# Patient Record
Sex: Male | Born: 1960 | Race: Black or African American | Hispanic: No | Marital: Single | Smoking: Current every day smoker
Health system: Southern US, Community
[De-identification: ages and names within clinical notes are randomized; demographics above are authoritative.]

## PROBLEM LIST (undated history)

## (undated) DIAGNOSIS — J45909 Unspecified asthma, uncomplicated: Secondary | ICD-10-CM

## (undated) DIAGNOSIS — J449 Chronic obstructive pulmonary disease, unspecified: Secondary | ICD-10-CM

---

## 2021-04-29 ENCOUNTER — Other Ambulatory Visit: Payer: Self-pay

## 2021-04-29 ENCOUNTER — Encounter (HOSPITAL_BASED_OUTPATIENT_CLINIC_OR_DEPARTMENT_OTHER): Payer: Self-pay | Admitting: Emergency Medicine

## 2021-04-29 ENCOUNTER — Emergency Department (HOSPITAL_BASED_OUTPATIENT_CLINIC_OR_DEPARTMENT_OTHER)
Admission: EM | Admit: 2021-04-29 | Discharge: 2021-04-29 | Disposition: A | Discharge status: Home / Self Care | Payer: Medicaid Other | Attending: Emergency Medicine | Admitting: Emergency Medicine

## 2021-04-29 ENCOUNTER — Emergency Department (HOSPITAL_BASED_OUTPATIENT_CLINIC_OR_DEPARTMENT_OTHER): Payer: Medicaid Other

## 2021-04-29 DIAGNOSIS — R109 Unspecified abdominal pain: Secondary | ICD-10-CM | POA: Insufficient documentation

## 2021-04-29 DIAGNOSIS — M5432 Sciatica, left side: Secondary | ICD-10-CM | POA: Insufficient documentation

## 2021-04-29 DIAGNOSIS — M549 Dorsalgia, unspecified: Secondary | ICD-10-CM | POA: Diagnosis present

## 2021-04-29 LAB — COMPREHENSIVE METABOLIC PANEL
ALT: 33 U/L (ref 0–44)
AST: 48 U/L — ABNORMAL HIGH (ref 15–41)
Albumin: 4.1 g/dL (ref 3.5–5.0)
Alkaline Phosphatase: 59 U/L (ref 38–126)
Anion gap: 13 (ref 5–15)
BUN: 7 mg/dL (ref 6–20)
CO2: 21 mmol/L — ABNORMAL LOW (ref 22–32)
Calcium: 9 mg/dL (ref 8.9–10.3)
Chloride: 102 mmol/L (ref 98–111)
Creatinine, Ser: 0.77 mg/dL (ref 0.61–1.24)
GFR, Estimated: >60 mL/min (ref >60)
Glucose, Bld: 86 mg/dL (ref 70–99)
Potassium: 4 mmol/L (ref 3.5–5.1)
Sodium: 136 mmol/L (ref 135–145)
Total Bilirubin: 0.3 mg/dL (ref 0.3–1.2)
Total Protein: 7.9 g/dL (ref 6.5–8.1)

## 2021-04-29 LAB — CBC WITH DIFFERENTIAL/PLATELET
Abs Immature Granulocytes: 0.02 10*3/uL (ref 0.00–0.07)
Basophils Absolute: 0 10*3/uL (ref 0.0–0.1)
Basophils Relative: 0 %
Eosinophils Absolute: 0.2 10*3/uL (ref 0.0–0.5)
Eosinophils Relative: 2 %
HCT: 39.4 % (ref 39.0–52.0)
Hemoglobin: 13.5 g/dL (ref 13.0–17.0)
Immature Granulocytes: 0 %
Lymphocytes Relative: 14 %
Lymphs Abs: 1.2 10*3/uL (ref 0.7–4.0)
MCH: 29.9 pg (ref 26.0–34.0)
MCHC: 34.3 g/dL (ref 30.0–36.0)
MCV: 87.4 fL (ref 80.0–100.0)
Monocytes Absolute: 0.6 10*3/uL (ref 0.1–1.0)
Monocytes Relative: 6 %
Neutro Abs: 7 10*3/uL (ref 1.7–7.7)
Neutrophils Relative %: 78 %
Platelets: 276 10*3/uL (ref 150–400)
RBC: 4.51 MIL/uL (ref 4.22–5.81)
RDW: 15.2 % (ref 11.5–15.5)
WBC: 9 10*3/uL (ref 4.0–10.5)
nRBC: 0 % (ref 0.0–0.2)

## 2021-04-29 LAB — URINALYSIS, ROUTINE W REFLEX MICROSCOPIC
Bilirubin Urine: NEGATIVE
Glucose, UA: NEGATIVE mg/dL
Hgb urine dipstick: NEGATIVE
Ketones, ur: NEGATIVE mg/dL
Leukocytes,Ua: NEGATIVE
Nitrite: NEGATIVE
Protein, ur: NEGATIVE mg/dL
Specific Gravity, Urine: 1.015 (ref 1.005–1.030)
pH: 5.5 (ref 5.0–8.0)

## 2021-04-29 LAB — LIPASE, BLOOD: Lipase: 29 U/L (ref 11–51)

## 2021-04-29 MED ORDER — PREDNISONE 50 MG PO TABS: 50.0000 mg | ORAL_TABLET | Freq: Every day | ORAL | 0 refills | Status: AC

## 2021-04-29 MED ORDER — ONDANSETRON 4 MG PO TBDP
8.0000 mg | ORAL_TABLET | Freq: Once | ORAL | Status: AC
  Administered 2021-04-29: 8 mg via ORAL
  Filled 2021-04-29: qty 2

## 2021-04-29 MED ORDER — NAPROXEN 375 MG PO TABS: 375.0000 mg | ORAL_TABLET | Freq: Two times a day (BID) | ORAL | 0 refills | Status: AC

## 2021-04-29 MED ORDER — HYDROMORPHONE HCL 1 MG/ML IJ SOLN
1.0000 mg | Freq: Once | INTRAMUSCULAR | Status: AC
  Administered 2021-04-29: 1 mg via INTRAVENOUS
  Filled 2021-04-29: qty 1

## 2021-04-29 MED ORDER — HYDROCODONE-ACETAMINOPHEN 5-325 MG PO TABS: 1.0000 | ORAL_TABLET | Freq: Four times a day (QID) | ORAL | 0 refills | Status: AC | PRN

## 2021-04-29 MED ORDER — ONDANSETRON HCL 4 MG/2ML IJ SOLN
4.0000 mg | Freq: Once | INTRAMUSCULAR | Status: AC
  Administered 2021-04-29: 4 mg via INTRAVENOUS
  Filled 2021-04-29: qty 2

## 2021-04-29 NOTE — ED Triage Notes (Signed)
Vomiting in triage x 1.

## 2021-04-29 NOTE — ED Triage Notes (Signed)
Patient presents with complaints of mid to lower back pain; onset 2-3 days ago; states recently moved some furniture and unsure if that has caused it. States took tylenol yesterday with no relief. Ambulatory to triage.

## 2021-04-29 NOTE — ED Provider Notes (Signed)
[facility] EMERGENCY DEPARTMENT Provider Note   CSN: [id number] Arrival date & time: 04/29/21  0086     History Chief Complaint  Patient presents with   Back Pain    Scott Colon is a 60 y.o. male.   Back Pain  Patient presents ED with complaints of left sided flank pain that started a few days ago.  The pain is sharp and severe and radiates down the left side towards his leg.  He denies any recent falls or injuries.  He did do some furniture moving recently.  He tried taking some Tylenol without relief.  He is able to walk and is not having numbness or weakness.  He denies having abdominal pain.  He did have an episode of nausea and vomiting with this pain.  The pain is very severe.  He denies any fevers or chills.  He does have history of prior back problems but no kidney stones.  Patient states 1 time he had to have an injection in his back for his troubles.  History reviewed. No pertinent past medical history.  There are no problems to display for this patient.   History reviewed. No pertinent surgical history.     No family history on file.     Home Medications Prior to Admission medications   Medication Sig Start Date End Date Taking? Authorizing Provider  HYDROcodone-acetaminophen (NORCO/VICODIN) 5-325 MG tablet Take 1 tablet by mouth every 6 (six) hours as needed. 04/29/21  Yes Linwood Dibbles, MD  naproxen (NAPROSYN) 375 MG tablet Take 1 tablet (375 mg total) by mouth 2 (two) times daily. 04/29/21  Yes Linwood Dibbles, MD  predniSONE (DELTASONE) 50 MG tablet Take 1 tablet (50 mg total) by mouth daily for 7 days. 04/29/21 05/06/21 Yes Linwood Dibbles, MD    Allergies    Patient has no known allergies.  Review of Systems   Review of Systems  Musculoskeletal:  Positive for back pain.  All other systems reviewed and are negative.  Physical Exam Updated Vital Signs BP 117/73   Pulse 69   Temp 98.1 F (36.7 C) (Oral)   Resp 16   Ht 1.854 m (6\' 1" )   Wt 69.9 kg    SpO2 96%   BMI 20.32 kg/m   Physical Exam Vitals and nursing note reviewed.  Constitutional:      General: He is not in acute distress.    Appearance: He is well-developed.  HENT:     Head: Normocephalic and atraumatic.     Right Ear: External ear normal.     Left Ear: External ear normal.  Eyes:     General: No scleral icterus.       Right eye: No discharge.        Left eye: No discharge.     Conjunctiva/sclera: Conjunctivae normal.  Neck:     Trachea: No tracheal deviation.  Cardiovascular:     Rate and Rhythm: Normal rate and regular rhythm.  Pulmonary:     Effort: Pulmonary effort is normal. No respiratory distress.     Breath sounds: Normal breath sounds. No stridor. No wheezing or rales.  Abdominal:     General: Bowel sounds are normal. There is no distension.     Palpations: Abdomen is soft.     Tenderness: There is no abdominal tenderness. There is left CVA tenderness. There is no guarding or rebound.  Musculoskeletal:        General: No tenderness or deformity.     Cervical  back: Neck supple.     Comments: Tenderness palpation paraspinal muscles in the left lumbar spine region and left costovertebral angle region  Skin:    General: Skin is warm and dry.     Findings: No rash.  Neurological:     General: No focal deficit present.     Mental Status: He is alert.     Cranial Nerves: No cranial nerve deficit (no facial droop, extraocular movements intact, no slurred speech).     Sensory: No sensory deficit.     Motor: No abnormal muscle tone or seizure activity.     Coordination: Coordination normal.     Comments: 5 out of 5 plantar flexion and dorsiflexion, normal sensation  Psychiatric:        Mood and Affect: Mood normal.    ED Results / Procedures / Treatments   Labs (all labs ordered are listed, but only abnormal results are displayed) Labs Reviewed  COMPREHENSIVE METABOLIC PANEL - Abnormal; Notable for the following components:      Result Value   CO2  21 (*)    AST 48 (*)    All other components within normal limits  LIPASE, BLOOD  CBC WITH DIFFERENTIAL/PLATELET  URINALYSIS, ROUTINE W REFLEX MICROSCOPIC    EKG None  Radiology CT RENAL STONE STUDY  Result Date: 04/29/2021 CLINICAL DATA:  Flank pain for 2 days, initial encounter EXAM: CT ABDOMEN AND PELVIS WITHOUT CONTRAST TECHNIQUE: Multidetector CT imaging of the abdomen and pelvis was performed following the standard protocol without IV contrast. COMPARISON:  None. FINDINGS: Lower chest: No acute abnormality. Hepatobiliary: No focal liver abnormality is seen. Status post cholecystectomy. No biliary dilatation. Pancreas: Unremarkable. No pancreatic ductal dilatation or surrounding inflammatory changes. Spleen: Normal in size without focal abnormality. Adrenals/Urinary Tract: Adrenal glands are within normal limits. Kidneys demonstrate no renal calculi or urinary tract obstructive changes. Ureters are within normal limits. Bladder is well distended. Stomach/Bowel: Colon shows no obstructive or inflammatory changes. Small appendix is noted without inflammatory change. Small bowel and stomach appear within normal limits. Vascular/Lymphatic: Aortic atherosclerosis. No enlarged abdominal or pelvic lymph nodes. Reproductive: Prostate is unremarkable. Other: No abdominal wall hernia or abnormality. No abdominopelvic ascites. Musculoskeletal: Degenerative changes of the sacroiliac joints are noted bilaterally. No acute bony abnormality is seen. IMPRESSION: No renal calculi or urinary tract obstructive changes are seen. No acute abnormality to correspond with the given clinical history is seen. Electronically Signed   By: Alcide Clever M.D.   On: 04/29/2021 08:15    Procedures Procedures   Medications Ordered in ED Medications  ondansetron (ZOFRAN-ODT) disintegrating tablet 8 mg (8 mg Oral Given 04/29/21 0614)  ondansetron (ZOFRAN) injection 4 mg (4 mg Intravenous Given 04/29/21 0750)  HYDROmorphone  (DILAUDID) injection 1 mg (1 mg Intravenous Given 04/29/21 0751)    ED Course  I have reviewed the triage vital signs and the nursing notes.  Pertinent labs & imaging results that were available during my care of the patient were reviewed by me and considered in my medical decision making (see chart for details).  Clinical Course as of 04/29/21 0912  Wed Apr 29, 2021  0855 CBC and metabolic panel are normal [JK]  0856 UA is negative [JK]  0856 CT scan without acute findings [JK]    Clinical Course User Index [JK] Linwood Dibbles, MD   MDM Rules/Calculators/A&P  Patient presents to the ED for evaluation of of back pain, flank pain.  Symptoms concerning for the possibility of sciatica versus ureteral stone.  Patient was treated with pain medications.  Laboratory tests are reassuring.  No signs to suggest infection.  No signs of anemia.  CT scan was performed and there is no evidence of aneurysm or ureteral stone.  No other acute abdominal pathology.  No bony abnormalities noted.  Patient was treated with pain medications.  I suspect his symptoms are most likely related to sciatica at this point.  He does not show any signs of acute neurologic dysfunction and does not require any further imaging at this time.  Will discharge home with course of steroids and pain medications.  Recommend follow-up with primary care doctor or spine doctor Final Clinical Impression(s) / ED Diagnoses Final diagnoses:  Sciatica of left side    Rx / DC Orders ED Discharge Orders          Ordered    HYDROcodone-acetaminophen (NORCO/VICODIN) 5-325 MG tablet  Every 6 hours PRN        04/29/21 0911    naproxen (NAPROSYN) 375 MG tablet  2 times daily        04/29/21 0911    predniSONE (DELTASONE) 50 MG tablet  Daily        04/29/21 0911             Linwood Dibbles, MD 04/29/21 [phone]

## 2021-04-29 NOTE — Discharge Instructions (Addendum)
Take the medications as prescribed to help with the pain and discomfort.  Follow-up with your doctor or consider seeing a spine doctor if the symptoms persist.  Try to rest and avoid any heavy lifting

## 2022-05-30 IMAGING — CT CT RENAL STONE PROTOCOL
2 of 4 series · 16 of 46 positions shown, 18 images · non-contrast
Comparison: None.

CLINICAL DATA: Flank pain for 2 days, initial encounter

EXAM:
CT ABDOMEN AND PELVIS WITHOUT CONTRAST
TECHNIQUE: Multidetector CT imaging of the abdomen and pelvis was performed
following the standard protocol without IV contrast.

[Series 2: axial st · axial · 0.80mm/px · z∈[-609,-129]mm · 13 of 104 slices shown, 15 images]
[im 4/104  soft-tissue]
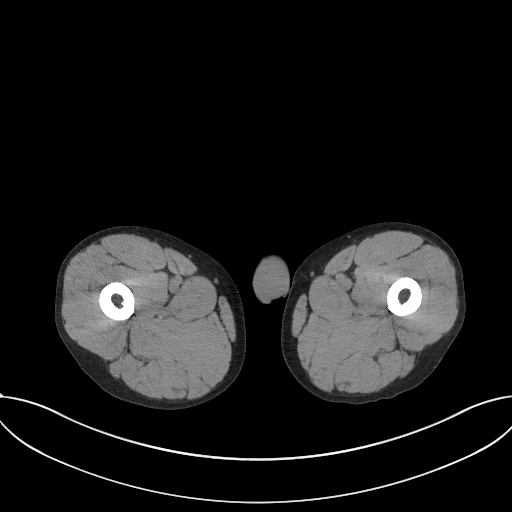
[im 4/104  bone]
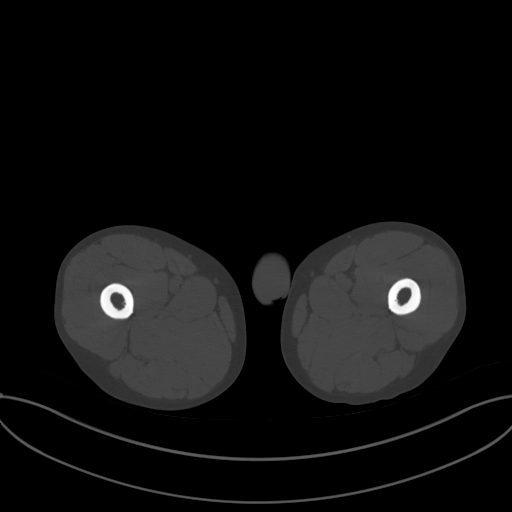
[im 12/104  soft-tissue]
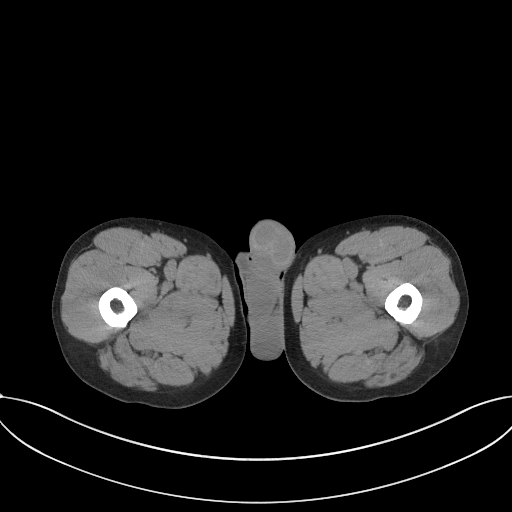
[im 20/104  soft-tissue]
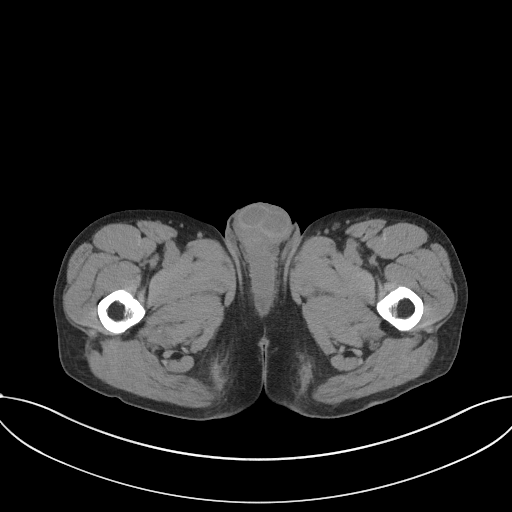
[im 28/104  soft-tissue]
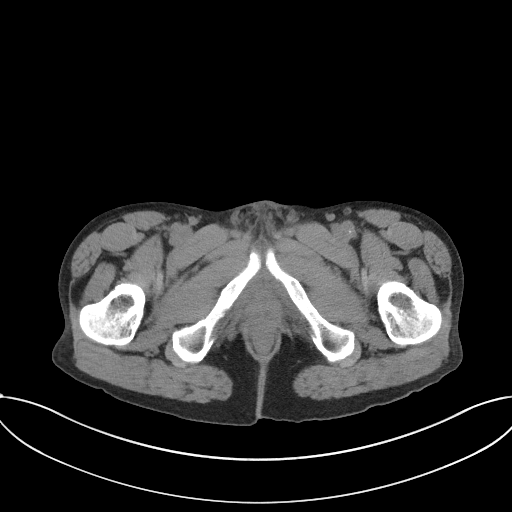
[im 36/104  soft-tissue]
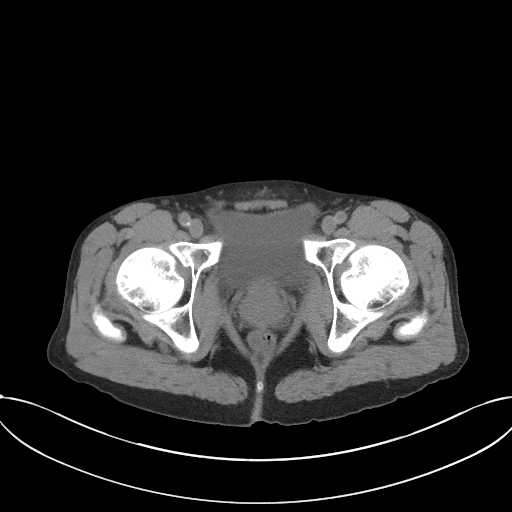
[im 44/104  soft-tissue]
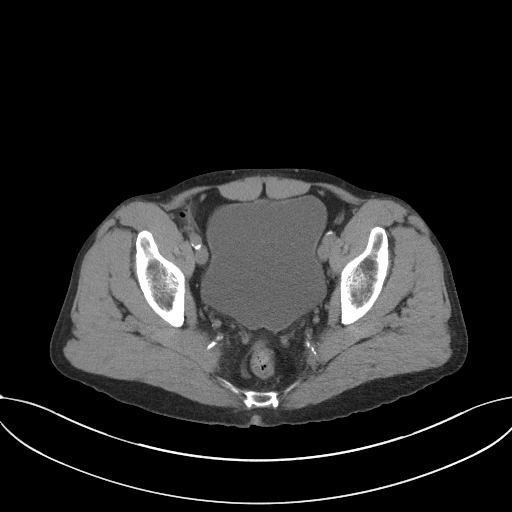
[im 52/104  soft-tissue]
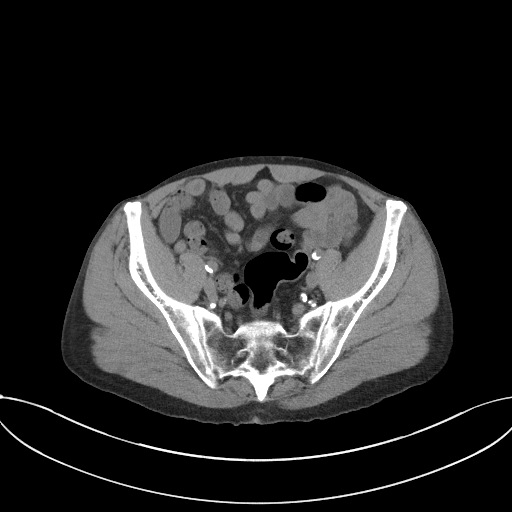
[im 60/104  soft-tissue]
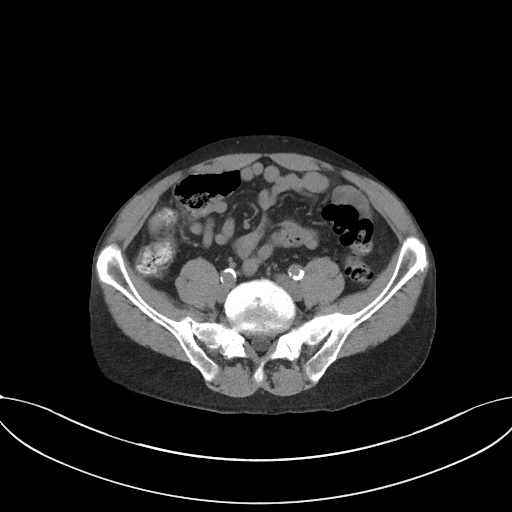
[im 68/104  soft-tissue]
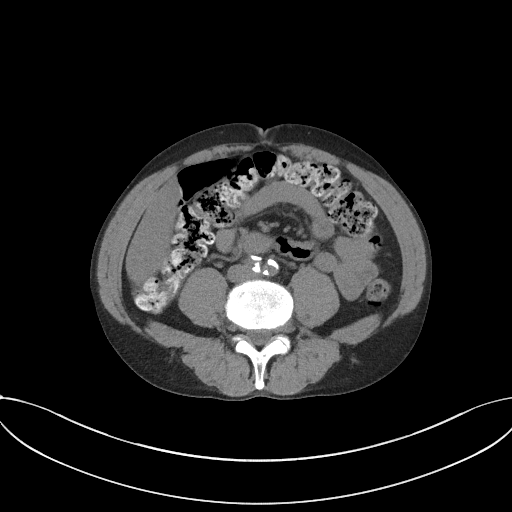
[im 68/104  bone]
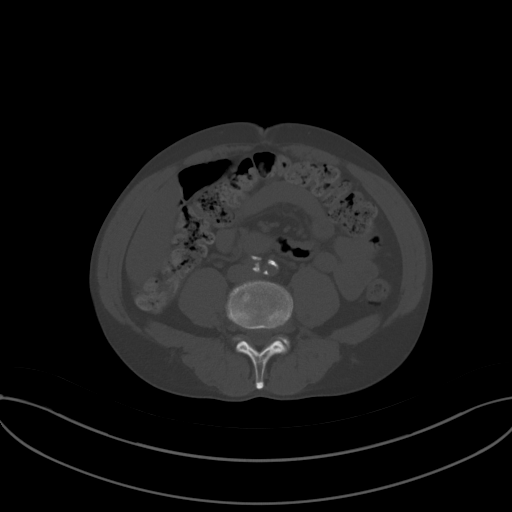
[im 76/104  soft-tissue]
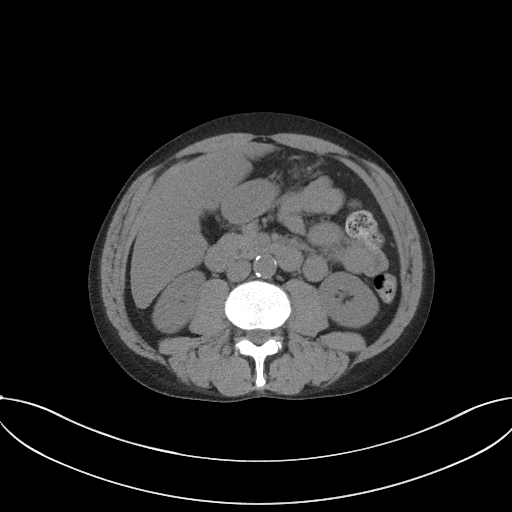
[im 84/104  soft-tissue]
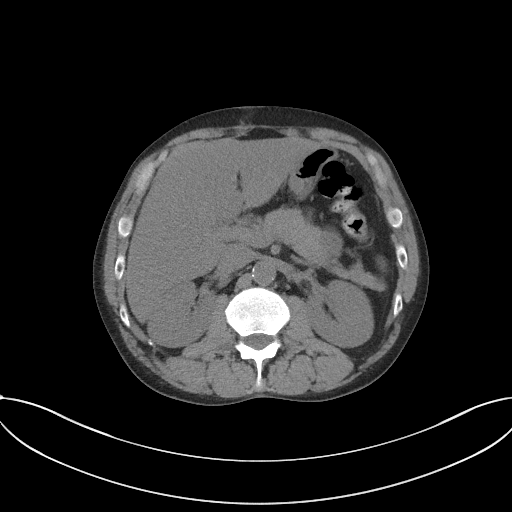
[im 92/104  soft-tissue]
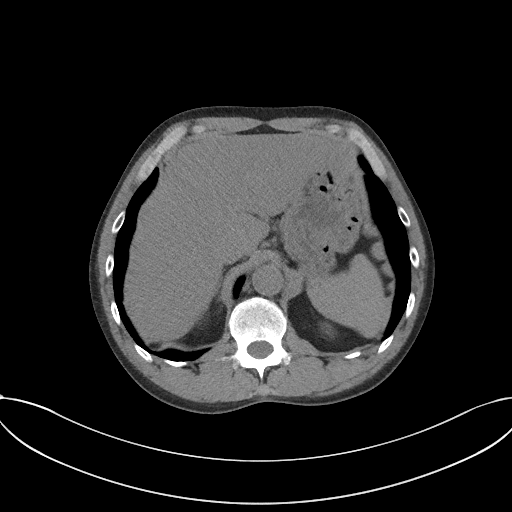
[im 100/104  soft-tissue]
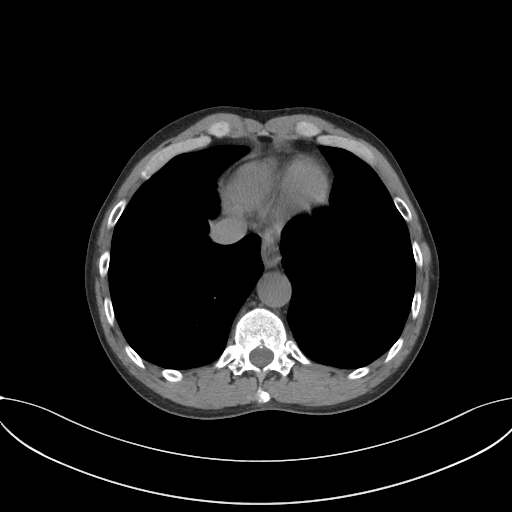

[Series 5: coronal st · coronal · 0.74mm/px · 3 of 89 slices shown]
[im 30/89  soft-tissue]
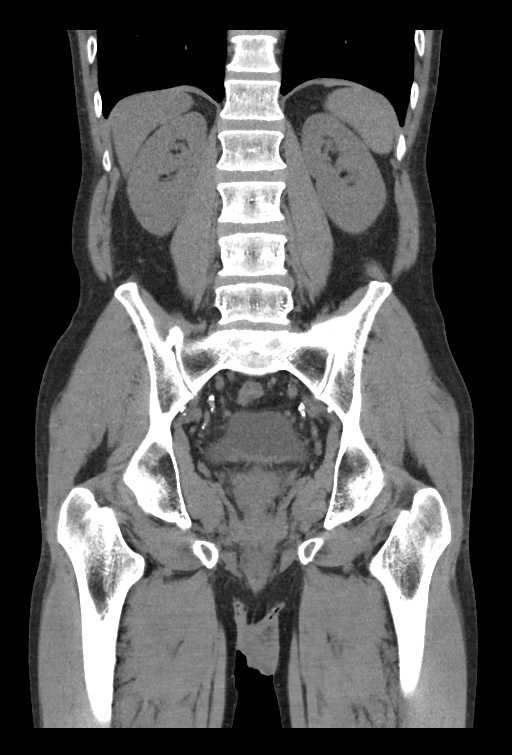
[im 40/89  soft-tissue]
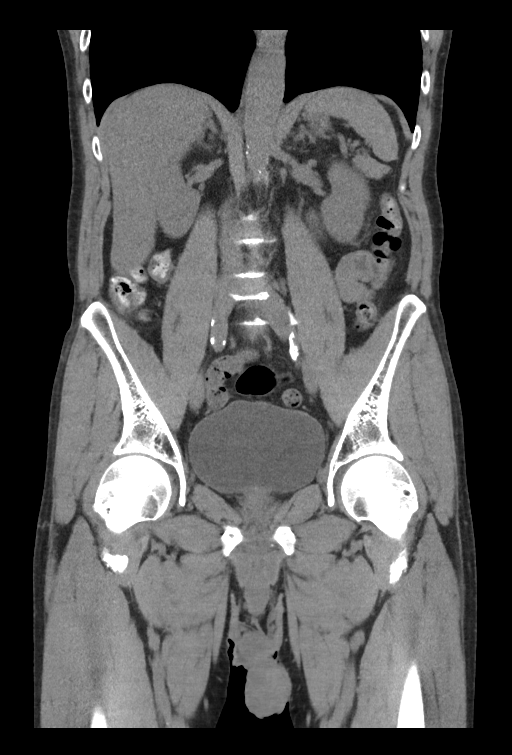
[im 49/89  soft-tissue]
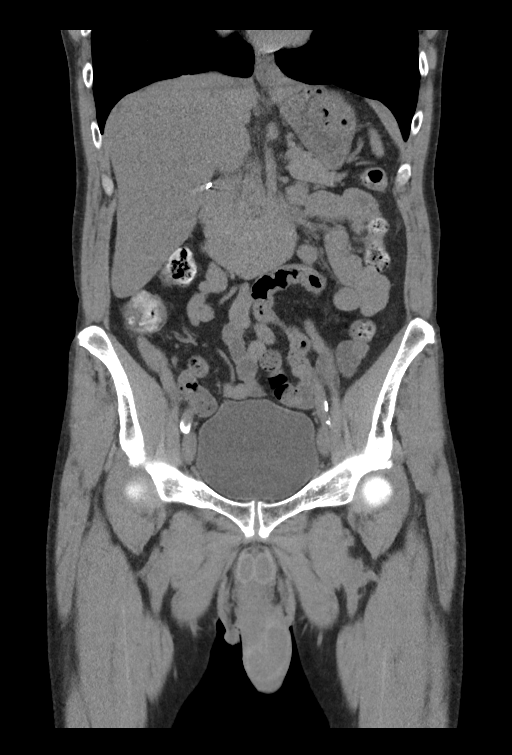

[16 of 46 positions shown; findings below may reference images not displayed]

FINDINGS: Lower chest: No acute abnormality.

Hepatobiliary: No focal liver abnormality is seen. Status post
cholecystectomy. No biliary dilatation.

Pancreas: Unremarkable. No pancreatic ductal dilatation or
surrounding inflammatory changes.

Spleen: Normal in size without focal abnormality.

Adrenals/Urinary Tract: Adrenal glands are within normal limits.
Kidneys demonstrate no renal calculi or urinary tract obstructive
changes. Ureters are within normal limits. Bladder is well
distended.

Stomach/Bowel: Colon shows no obstructive or inflammatory changes.
Small appendix is noted without inflammatory change. Small bowel and
stomach appear within normal limits.

Vascular/Lymphatic: Aortic atherosclerosis. No enlarged abdominal or
pelvic lymph nodes.

Reproductive: Prostate is unremarkable.

Other: No abdominal wall hernia or abnormality. No abdominopelvic
ascites.

Musculoskeletal: Degenerative changes of the sacroiliac joints are
noted bilaterally. No acute bony abnormality is seen.
IMPRESSION: No renal calculi or urinary tract obstructive changes are seen.

No acute abnormality to correspond with the given clinical history
is seen.

## 2023-02-19 ENCOUNTER — Emergency Department (HOSPITAL_COMMUNITY)
Admission: EM | Admit: 2023-02-19 | Discharge: 2023-02-19 | Discharge status: Against Medical Advice | Payer: Medicaid Other | Attending: Student | Admitting: Student

## 2023-02-19 ENCOUNTER — Other Ambulatory Visit: Payer: Self-pay

## 2023-02-19 DIAGNOSIS — R109 Unspecified abdominal pain: Secondary | ICD-10-CM | POA: Insufficient documentation

## 2023-02-19 DIAGNOSIS — Z5321 Procedure and treatment not carried out due to patient leaving prior to being seen by health care provider: Secondary | ICD-10-CM | POA: Diagnosis not present

## 2023-02-19 DIAGNOSIS — R1110 Vomiting, unspecified: Secondary | ICD-10-CM | POA: Insufficient documentation

## 2023-02-19 LAB — CBC
HCT: 40 % (ref 39.0–52.0)
Hemoglobin: 13.7 g/dL (ref 13.0–17.0)
MCH: 29.3 pg (ref 26.0–34.0)
MCHC: 34.3 g/dL (ref 30.0–36.0)
MCV: 85.7 fL (ref 80.0–100.0)
Platelets: 250 10*3/uL (ref 150–400)
RBC: 4.67 MIL/uL (ref 4.22–5.81)
RDW: 14.5 % (ref 11.5–15.5)
WBC: 5.7 10*3/uL (ref 4.0–10.5)
nRBC: 0 % (ref 0.0–0.2)

## 2023-02-19 LAB — COMPREHENSIVE METABOLIC PANEL
ALT: 50 U/L — ABNORMAL HIGH (ref 0–44)
AST: 58 U/L — ABNORMAL HIGH (ref 15–41)
Albumin: 4.5 g/dL (ref 3.5–5.0)
Alkaline Phosphatase: 53 U/L (ref 38–126)
Anion gap: 14 (ref 5–15)
BUN: 13 mg/dL (ref 6–20)
CO2: 23 mmol/L (ref 22–32)
Calcium: 9.4 mg/dL (ref 8.9–10.3)
Chloride: 98 mmol/L (ref 98–111)
Creatinine, Ser: 1.2 mg/dL (ref 0.61–1.24)
GFR, Estimated: >60 mL/min (ref >60)
Glucose, Bld: 94 mg/dL (ref 70–99)
Potassium: 4.1 mmol/L (ref 3.5–5.1)
Sodium: 135 mmol/L (ref 135–145)
Total Bilirubin: 0.7 mg/dL (ref 0.3–1.2)
Total Protein: 7.8 g/dL (ref 6.5–8.1)

## 2023-02-19 LAB — LIPASE, BLOOD: Lipase: 24 U/L (ref 11–51)

## 2023-02-19 NOTE — ED Notes (Signed)
Pt left AMA °

## 2023-02-19 NOTE — ED Triage Notes (Addendum)
Patient arrived with EMS from home reports pancreatitis flare  up this evening with mid abdominal pain and emesis . He received Zofran 4 mg IV by EMS . CBG=121.

## 2023-02-22 ENCOUNTER — Encounter (HOSPITAL_COMMUNITY): Payer: Self-pay

## 2023-02-22 ENCOUNTER — Emergency Department (HOSPITAL_COMMUNITY)
Admission: EM | Admit: 2023-02-22 | Discharge: 2023-02-22 | Disposition: A | Discharge status: Home / Self Care | Payer: Medicaid Other | Attending: Emergency Medicine | Admitting: Emergency Medicine

## 2023-02-22 ENCOUNTER — Other Ambulatory Visit: Payer: Self-pay

## 2023-02-22 ENCOUNTER — Emergency Department (HOSPITAL_COMMUNITY): Payer: Medicaid Other

## 2023-02-22 DIAGNOSIS — R1010 Upper abdominal pain, unspecified: Secondary | ICD-10-CM | POA: Diagnosis not present

## 2023-02-22 DIAGNOSIS — N179 Acute kidney failure, unspecified: Secondary | ICD-10-CM | POA: Diagnosis not present

## 2023-02-22 DIAGNOSIS — J45909 Unspecified asthma, uncomplicated: Secondary | ICD-10-CM | POA: Insufficient documentation

## 2023-02-22 DIAGNOSIS — R112 Nausea with vomiting, unspecified: Secondary | ICD-10-CM | POA: Diagnosis not present

## 2023-02-22 DIAGNOSIS — R109 Unspecified abdominal pain: Secondary | ICD-10-CM | POA: Diagnosis present

## 2023-02-22 DIAGNOSIS — J449 Chronic obstructive pulmonary disease, unspecified: Secondary | ICD-10-CM | POA: Insufficient documentation

## 2023-02-22 HISTORY — DX: Unspecified asthma, uncomplicated: J45.909

## 2023-02-22 HISTORY — DX: Chronic obstructive pulmonary disease, unspecified: J44.9

## 2023-02-22 LAB — CBC WITH DIFFERENTIAL/PLATELET
Abs Immature Granulocytes: 0.01 10*3/uL (ref 0.00–0.07)
Basophils Absolute: 0 10*3/uL (ref 0.0–0.1)
Basophils Relative: 0 %
Eosinophils Absolute: 0 10*3/uL (ref 0.0–0.5)
Eosinophils Relative: 0 %
HCT: 44.7 % (ref 39.0–52.0)
Hemoglobin: 15.1 g/dL (ref 13.0–17.0)
Immature Granulocytes: 0 %
Lymphocytes Relative: 32 %
Lymphs Abs: 2 10*3/uL (ref 0.7–4.0)
MCH: 28.8 pg (ref 26.0–34.0)
MCHC: 33.8 g/dL (ref 30.0–36.0)
MCV: 85.1 fL (ref 80.0–100.0)
Monocytes Absolute: 1 10*3/uL (ref 0.1–1.0)
Monocytes Relative: 16 %
Neutro Abs: 3.3 10*3/uL (ref 1.7–7.7)
Neutrophils Relative %: 52 %
Platelets: 280 10*3/uL (ref 150–400)
RBC: 5.25 MIL/uL (ref 4.22–5.81)
RDW: 13.5 % (ref 11.5–15.5)
WBC: 6.4 10*3/uL (ref 4.0–10.5)
nRBC: 0 % (ref 0.0–0.2)

## 2023-02-22 LAB — COMPREHENSIVE METABOLIC PANEL
ALT: 73 U/L — ABNORMAL HIGH (ref 0–44)
AST: 63 U/L — ABNORMAL HIGH (ref 15–41)
Albumin: 4.4 g/dL (ref 3.5–5.0)
Alkaline Phosphatase: 45 U/L (ref 38–126)
Anion gap: 15 (ref 5–15)
BUN: 25 mg/dL — ABNORMAL HIGH (ref 6–20)
CO2: 18 mmol/L — ABNORMAL LOW (ref 22–32)
Calcium: 9.2 mg/dL (ref 8.9–10.3)
Chloride: 98 mmol/L (ref 98–111)
Creatinine, Ser: 1.46 mg/dL — ABNORMAL HIGH (ref 0.61–1.24)
GFR, Estimated: 55 mL/min — ABNORMAL LOW (ref >60)
Glucose, Bld: 100 mg/dL — ABNORMAL HIGH (ref 70–99)
Potassium: 3.4 mmol/L — ABNORMAL LOW (ref 3.5–5.1)
Sodium: 131 mmol/L — ABNORMAL LOW (ref 135–145)
Total Bilirubin: 1.1 mg/dL (ref 0.3–1.2)
Total Protein: 8.3 g/dL — ABNORMAL HIGH (ref 6.5–8.1)

## 2023-02-22 LAB — TROPONIN I (HIGH SENSITIVITY)
Troponin I (High Sensitivity): 13 ng/L (ref <18)
Troponin I (High Sensitivity): 10 ng/L (ref <18)

## 2023-02-22 LAB — LIPASE, BLOOD: Lipase: 25 U/L (ref 11–51)

## 2023-02-22 MED ORDER — SODIUM CHLORIDE 0.9 % IV BOLUS
1000.0000 mL | Freq: Once | INTRAVENOUS | Status: AC
  Administered 2023-02-22: 1000 mL via INTRAVENOUS

## 2023-02-22 MED ORDER — ONDANSETRON HCL 4 MG/2ML IJ SOLN
4.0000 mg | Freq: Once | INTRAMUSCULAR | Status: AC
  Administered 2023-02-22: 4 mg via INTRAVENOUS
  Filled 2023-02-22: qty 2

## 2023-02-22 MED ORDER — OXYCODONE-ACETAMINOPHEN 5-325 MG PO TABS
1.0000 | ORAL_TABLET | Freq: Once | ORAL | Status: AC
  Administered 2023-02-22: 1 via ORAL
  Filled 2023-02-22: qty 1

## 2023-02-22 MED ORDER — PANTOPRAZOLE SODIUM 40 MG PO TBEC: 40.0000 mg | DELAYED_RELEASE_TABLET | Freq: Every day | ORAL | 0 refills | Status: DC

## 2023-02-22 MED ORDER — SUCRALFATE 1 GM/10ML PO SUSP: 1.0000 g | Freq: Three times a day (TID) | ORAL | 0 refills | Status: DC

## 2023-02-22 MED ORDER — SODIUM CHLORIDE (PF) 0.9 % IJ SOLN
INTRAMUSCULAR | Status: AC
  Filled 2023-02-22: qty 50

## 2023-02-22 MED ORDER — IOHEXOL 300 MG/ML  SOLN
100.0000 mL | Freq: Once | INTRAMUSCULAR | Status: AC | PRN
  Administered 2023-02-22: 100 mL via INTRAVENOUS

## 2023-02-22 MED ORDER — ONDANSETRON HCL 4 MG PO TABS: 4.0000 mg | ORAL_TABLET | Freq: Four times a day (QID) | ORAL | 0 refills | Status: DC | PRN

## 2023-02-22 NOTE — Discharge Instructions (Addendum)
It was a pleasure take care of you today!  Your labs and CT scan did not show any concerning acute emergent findings at this time.  You were sent a prescription for Zofran, Protonix, Carafate, take as directed.  If you take the Zofran, wait at least 30 minutes before you attempt small sips/small bites of food/fluid.  Ensure to maintain fluid intake with water, tea, soup, Gatorade, Pedialyte.  Follow-up with your primary care provider regarding today's ED visit.  Return to the emergency department if you experience increasing/worsening symptoms.

## 2023-02-22 NOTE — ED Provider Notes (Signed)
[place] EMERGENCY DEPARTMENT AT [facility] Provider Note   CSN: [id number] Arrival date & time: 02/22/23  0547     History  Chief Complaint  Patient presents with   Emesis   Chest Pain   Abdominal Pain    Scott Colon is a 62 y.o. male with a PMHx of COPD, asthma, who presents to the ED with concerns for abdominal pain onset 3 days.  He notes that he thinks is a flare of his pancreatitis.  Patient does not have a gallbladder at this time.  Patient drinks 14-15 beers daily.  Patient has associated left-sided chest pain that radiates to his left arm that started yesterday. Denies shortness of breath, diarrhea, constipation, urinary symptoms. Denies PMHx of MI, DM, HTN, CAD, family history of MI in someone younger than age 30, stents.   The history is provided by the patient. No language interpreter was used.       Home Medications Prior to Admission medications   Medication Sig Start Date End Date Taking? Authorizing Provider  ondansetron (ZOFRAN) 4 MG tablet Take 1 tablet (4 mg total) by mouth every 6 (six) hours as needed for nausea or vomiting. 02/22/23  Yes Khai Torbert A, PA-C  pantoprazole (PROTONIX) 40 MG tablet Take 1 tablet (40 mg total) by mouth daily. 02/22/23  Yes Vraj Denardo A, PA-C  sucralfate (CARAFATE) 1 GM/10ML suspension Take 10 mLs (1 g total) by mouth 4 (four) times daily -  with meals and at bedtime. 02/22/23  Yes Lucas Winograd A, PA-C  HYDROcodone-acetaminophen (NORCO/VICODIN) 5-325 MG tablet Take 1 tablet by mouth every 6 (six) hours as needed. 04/29/21   Linwood Dibbles, MD  naproxen (NAPROSYN) 375 MG tablet Take 1 tablet (375 mg total) by mouth 2 (two) times daily. 04/29/21   Linwood Dibbles, MD      Allergies    Patient has no known allergies.    Review of Systems   Review of Systems  Gastrointestinal:  Positive for vomiting.  All other systems reviewed and are negative.   Physical Exam Updated Vital Signs BP 101/76 (BP Location: Right Arm)    Pulse (!) 112   Temp (!) 97.4 F (36.3 C) (Oral)   Resp 20   Ht  (1.854 m)   Wt 68 kg   SpO2 99%   BMI 19.79 kg/m  Physical Exam Vitals and nursing note reviewed.  Constitutional:      General: He is not in acute distress.    Appearance: He is not diaphoretic.     Comments: Pt resting comfortably on stretcher  HENT:     Head: Normocephalic and atraumatic.     Mouth/Throat:     Pharynx: No oropharyngeal exudate.  Eyes:     General: No scleral icterus.    Conjunctiva/sclera: Conjunctivae normal.  Cardiovascular:     Rate and Rhythm: Normal rate and regular rhythm.     Pulses: Normal pulses.     Heart sounds: Normal heart sounds.  Pulmonary:     Effort: Pulmonary effort is normal. No respiratory distress.     Breath sounds: Normal breath sounds. No wheezing.  Abdominal:     General: Bowel sounds are normal.     Palpations: Abdomen is soft. There is no mass.     Tenderness: There is abdominal tenderness. There is no guarding or rebound.     Comments: Mild abdominal TTP.  Musculoskeletal:        General: Normal range of motion.  Cervical back: Normal range of motion and neck supple.  Skin:    General: Skin is warm and dry.  Neurological:     Mental Status: He is alert.  Psychiatric:        Behavior: Behavior normal.     ED Results / Procedures / Treatments   Labs (all labs ordered are listed, but only abnormal results are displayed) Labs Reviewed  COMPREHENSIVE METABOLIC PANEL - Abnormal; Notable for the following components:      Result Value   Sodium 131 (*)    Potassium 3.4 (*)    CO2 18 (*)    Glucose, Bld 100 (*)    BUN 25 (*)    Creatinine, Ser 1.46 (*)    Total Protein 8.3 (*)    AST 63 (*)    ALT 73 (*)    GFR, Estimated 55 (*)    All other components within normal limits  CBC WITH DIFFERENTIAL/PLATELET  LIPASE, BLOOD  TROPONIN I (HIGH SENSITIVITY)  TROPONIN I (HIGH SENSITIVITY)    EKG EKG Interpretation  Date/Time:  Tuesday February 22 2023 05:56:46 EDT Ventricular Rate:  110 PR Interval:  140 QRS Duration: 98 QT Interval:  338 QTC Calculation: 458 R Axis:   85 Text Interpretation: Sinus tachycardia Confirmed by Kristine Royal [phone]) on 02/22/2023 8:53:42 AM  Radiology CT ABDOMEN PELVIS W CONTRAST  Result Date: 02/22/2023 CLINICAL DATA:  Nonlocalized abdominal pain. EXAM: CT ABDOMEN AND PELVIS WITH CONTRAST TECHNIQUE: Multidetector CT imaging of the abdomen and pelvis was performed using the standard protocol following bolus administration of intravenous contrast. RADIATION DOSE REDUCTION: This exam was performed according to the departmental dose-optimization program which includes automated exposure control, adjustment of the mA and/or kV according to patient size and/or use of iterative reconstruction technique. CONTRAST:  OMNIPAQUE IOHEXOL 300 MG/ML  SOLN COMPARISON:  CT stone study 04/29/2021 FINDINGS: Lower chest: Unremarkable. Hepatobiliary: No suspicious focal abnormality within the liver parenchyma. 3 mm adjacent hypoattenuating lesions in the anterior liver are too small to characterize but almost assuredly benign. No followup imaging is recommended. Small area of low attenuation in the anterior liver, adjacent to the falciform ligament, is in a characteristic location for focal fatty deposition. Gallbladder is surgically absent. No intrahepatic or extrahepatic biliary dilation. Pancreas: No focal mass lesion. No dilatation of the main duct. No intraparenchymal cyst. No peripancreatic edema. Spleen: No splenomegaly. No focal mass lesion. Adrenals/Urinary Tract: No adrenal nodule or mass. Kidneys unremarkable. No evidence for hydroureter. The urinary bladder appears normal for the degree of distention. Stomach/Bowel: Stomach is unremarkable. No gastric wall thickening. No evidence of outlet obstruction. Duodenum is normally positioned as is the ligament of Treitz. No small bowel wall thickening. No small bowel  dilatation. The terminal ileum is normal. The appendix is not well visualized, but there is no edema or inflammation in the region of the cecum. No gross colonic mass. No colonic wall thickening. Vascular/Lymphatic: There is moderate atherosclerotic calcification of the abdominal aorta without aneurysm. There is no gastrohepatic or hepatoduodenal ligament lymphadenopathy. No retroperitoneal or mesenteric lymphadenopathy. No pelvic sidewall lymphadenopathy. Reproductive: The prostate gland and seminal vesicles are unremarkable. Other: No intraperitoneal free fluid. Musculoskeletal: No worrisome lytic or sclerotic osseous abnormality. IMPRESSION: 1. No acute findings in the abdomen or pelvis. Specifically, no findings to explain the patient's history of abdominal pain. 2.  Aortic Atherosclerosis (ICD10-I70.0). Electronically Signed   By: Kennith Center M.D.   On: 02/22/2023 11:04   DG Chest Portable 1  View  Result Date: 02/22/2023 CLINICAL DATA:  Chest pain and vomiting. EXAM: PORTABLE CHEST 1 VIEW COMPARISON:  None Available. FINDINGS: Lungs are hyperexpanded. The lungs are clear without focal pneumonia, edema, pneumothorax or pleural effusion. No evidence for pneumomediastinum. The cardiopericardial silhouette is within normal limits for size. No acute bony abnormality. Telemetry leads overlie the chest. IMPRESSION: Hyperexpansion without acute cardiopulmonary findings. Electronically Signed   By: Kennith Center M.D.   On: 02/22/2023 06:24    Procedures Procedures    Medications Ordered in ED Medications  sodium chloride 0.9 % bolus 1,000 mL (1,000 mLs Intravenous New Bag/Given 02/22/23 0622)  ondansetron (ZOFRAN) injection 4 mg (4 mg Intravenous Given 02/22/23 0621)  oxyCODONE-acetaminophen (PERCOCET/ROXICET) 5-325 MG per tablet 1 tablet (1 tablet Oral Given 02/22/23 0918)  iohexol (OMNIPAQUE) 300 MG/ML solution 100 mL (100 mLs Intravenous Contrast Given 02/22/23 1035)    ED Course/ Medical Decision  Making/ A&P Clinical Course as of 02/22/23 1133  Tue Feb 22, 2023  1011 Re-evaluated and resting comfortably on stretcher.  Patient notes that his pain is subsided however he feels some nausea at this time. [SB]  1114 Re-evaluated and resting comfortably on stretcher. Pt noted improvement of symptoms with treatment regimen. Discussed discharge treatment plan. Pt agreeable at this time. Pt appears safe for discharge. [SB]    Clinical Course User Index [SB] Alaija Ruble A, PA-C                             Medical Decision Making Amount and/or Complexity of Data Reviewed Radiology: ordered.  Risk Prescription drug management.   Patient presents to the emergency department with concerns for abdominal pain onset 3 days.  Thinks it is a flare of his pancreatitis.  Patient drinks 14-15 beers daily.  Also has left-sided chest pain started on yesterday.  No cardiac risk factors.  Patient afebrile, not tachycardic or hypoxic.  On exam patient with mild diffuse abdominal tenderness to palpation.  Remainder of exam without acute findings.  Differential diagnosis includes pancreatitis, appendicitis, diverticulitis, ACS, PNA.   Labs:  I ordered, and personally interpreted labs.  The pertinent results include:  Lipase unremarkable Initial troponin at 13, delta troponin at 10 CBC without leukocytosis CMP with slightly decreased sodium of 131, creatinine elevated to 1.46, BUN elevated 25, likely AKI.  AST and ALT slightly elevated (stable from previous value)  Imaging: I ordered imaging studies including Chest x-ray CT abdomen pelvis with I independently visualized and interpreted imaging which showed CXR without acute findings. CT AP with  1. No acute findings in the abdomen or pelvis. Specifically, no  findings to explain the patient's history of abdominal pain.  2.  Aortic Atherosclerosis   I agree with the radiologist interpretation  Medications:  I ordered medication including IV fluids,  Zofran, Percocet for symptom management. Reevaluation of the patient after these medicines and interventions, I reevaluated the patient and found that they have improved I have reviewed the patients home medicines and have made adjustments as needed Tolerated PO challenge in ED with above treatment regimen   Disposition: Presentation suspicious for likely viral etiology of symptoms. Doubt concerns for pancreatitis, diverticulitis, appendicitis. Doubt concerns at this time for ACS or PNA. After consideration of the diagnostic results and the patients response to treatment, I feel that the patient would benefit from Discharge home.  Patient discharged home with prescription for Zofran, Protonix, Carafate.  Patient provided with information for on-call  GI specialist for follow-up. Supportive care measures and strict return precautions discussed with patient at bedside. Pt acknowledges and verbalizes understanding. Pt appears safe for discharge. Follow up as indicated in discharge paperwork.   This chart was dictated using voice recognition software, Dragon. Despite the best efforts of this provider to proofread and correct errors, errors may still occur which can change documentation meaning.   Final Clinical Impression(s) / ED Diagnoses Final diagnoses:  Upper abdominal pain  Nausea and vomiting, unspecified vomiting type  AKI (acute kidney injury)    Rx / DC Orders ED Discharge Orders          Ordered    ondansetron (ZOFRAN) 4 MG tablet  Every 6 hours PRN        02/22/23 1118    pantoprazole (PROTONIX) 40 MG tablet  Daily        02/22/23 1118    sucralfate (CARAFATE) 1 GM/10ML suspension  3 times daily with meals & bedtime        02/22/23 1118              Breleigh Carpino A, PA-C 02/22/23 1133    Wynetta Fines, MD 02/22/23 1550

## 2023-02-22 NOTE — ED Triage Notes (Addendum)
Patient reports vomiting since Saturday and chest pain that started yesterday. States he has had issues with his pancreas before and this feel similar. Endorses left sided chest pain with radiation to left arm. States daily alcohol use.

## 2023-03-14 ENCOUNTER — Ambulatory Visit: Payer: Medicaid Other | Attending: Family Medicine

## 2023-03-14 ENCOUNTER — Other Ambulatory Visit: Payer: Self-pay

## 2023-03-14 DIAGNOSIS — R2689 Other abnormalities of gait and mobility: Secondary | ICD-10-CM

## 2023-03-14 DIAGNOSIS — M5459 Other low back pain: Secondary | ICD-10-CM | POA: Insufficient documentation

## 2023-03-14 DIAGNOSIS — M6281 Muscle weakness (generalized): Secondary | ICD-10-CM | POA: Insufficient documentation

## 2023-03-14 NOTE — Therapy (Addendum)
OUTPATIENT PHYSICAL THERAPY THORACOLUMBAR EVALUATION  PHYSICAL THERAPY DISCHARGE SUMMARY  Visits from Start of Care: 1  Current functional level related to goals / functional outcomes: N/A   Remaining deficits: N/A   Education / Equipment: HEP   Patient agrees to discharge. Patient goals were not met. Patient is being discharged due to not returning since the last visit.   Patient Name: Scott Colon MRN: [id number] DOB:1961/02/08, 62 y.o., male Today's Date: 03/14/2023  END OF SESSION:  PT End of Session - 03/14/23 0826     Visit Number 1    Number of Visits 17    Date for PT Re-Evaluation 05/09/23    Authorization Type [employer]    PT Start Time 0745    PT Stop Time 0820    PT Time Calculation (min) 35 min    Activity Tolerance Patient tolerated treatment well    Behavior During Therapy WFL for tasks assessed/performed             Past Medical History:  Diagnosis Date   Asthma    COPD (chronic obstructive pulmonary disease) (HCC)    History reviewed. No pertinent surgical history. There are no problems to display for this patient.   PCP: [facility]  REFERRING PROVIDER: Otho Darner, MD   REFERRING DIAG: Left Lumbar Radiculopathy   Rationale for Evaluation and Treatment: Rehabilitation  THERAPY DIAG:  Other low back pain - Plan: PT plan of care cert/re-cert  Muscle weakness (generalized) - Plan: PT plan of care cert/re-cert  Other abnormalities of gait and mobility - Plan: PT plan of care cert/re-cert  ONSET DATE: Chronic  SUBJECTIVE:                                                                                                                                                                                           SUBJECTIVE STATEMENT: Pt presents to PT with reports of chronic lower back pain with referral into L posterior hip. Denies trauma or MOI, notes pain has been gradually getting worse, denies b/b changes or  saddle anesthesia. Has very occasional N/T in L posterior thigh. Pain is worst at night or with prolonged sitting, improved with movement.   PERTINENT HISTORY:  COPD  PAIN:  Are you having pain?  Yes: NPRS scale: 9/10 Worst: 10/10 Pain location: L side of lower back; L hip Pain description: sharp Aggravating factors: prolonged sitting Relieving factors: movement   PRECAUTIONS: None  WEIGHT BEARING RESTRICTIONS: No  FALLS:  Has patient fallen in last 6 months? No  LIVING ENVIRONMENT: Lives with: lives with their family Lives in: House/apartment  OCCUPATION: Not currently  working  PLOF: Independent  PATIENT GOALS: decrease   OBJECTIVE:   DIAGNOSTIC FINDINGS:  N/A  PATIENT SURVEYS:  FOTO: 57% function; 64% predicted   COGNITION: Overall cognitive status: Within functional limits for tasks assessed     SENSATION: WFL  POSTURE: increased lumbar lordosis and flexed trunk   PALPATION: TTP to L piriformis  LOWER EXTREMITY MMT:    MMT Right eval Left eval  Hip flexion 5/5 4/5  Hip extension    Hip abduction 5/5 4/5  Hip adduction 5/5 4/5  Hip internal rotation    Hip external rotation    Knee flexion    Knee extension    Ankle dorsiflexion    Ankle plantarflexion    Ankle inversion    Ankle eversion     (Blank rows = not tested)  LUMBAR SPECIAL TESTS:  Straight leg raise test: Positive and Slump test: Positive  FUNCTIONAL TESTS:  Five Time Sit to Stand: 16 sec  GAIT: Distance walked: 31ft Assistive device utilized: None Level of assistance: Complete Independence Comments: antalgic gait towards L  TREATMENT: [facility] Adult PT Treatment:                                                DATE: 03/14/2023 Therapeutic Exercise: Seated sciatic nerve glide x 5 L Supine PPT x 5 - 5" hold LTR x 5  Supine piriformis stretch x 20" L Bridge x 5  PATIENT EDUCATION:  Education details: eval findings, FOTO, HEP, POC Person educated: Patient Education  method: Explanation, Demonstration, and Handouts Education comprehension: verbalized understanding and returned demonstration  HOME EXERCISE PROGRAM: Access Code: [email or web address] URL: [email or web address] Date: 03/14/2023 Prepared by: Edwinna Areola  Exercises - Seated Sciatic Tensioner  - 1 x daily - 7 x weekly - 2 sets - 10 reps - Supine Posterior Pelvic Tilt  - 1 x daily - 7 x weekly - 2 sets - 10 reps - 5 sec hold - Supine Lower Trunk Rotation  - 1 x daily - 7 x weekly - 2 sets - 10 reps - Supine Piriformis Stretch with Leg Straight  - 1 x daily - 7 x weekly - 2-3 reps - 30 sec hold - Supine Bridge  - 1 x daily - 7 x weekly - 2 sets - 10 reps  ASSESSMENT:  CLINICAL IMPRESSION: Patient is a 62 y.o. M who was seen today for physical therapy evaluation and treatment for chronic lower back and L hip pain. Physical findings are consistent with MD impression as pt has decrease in proximal hip strength, functional mobility, and positive neural tension provocation tests. His FOTO score shows he is operating below PLOF for subjective functional ability. He would benefit from skilled PT services working on improving core/hip strength and reducing neural tension down L LE.   OBJECTIVE IMPAIRMENTS: decreased activity tolerance, decreased endurance, decreased mobility, difficulty walking, decreased ROM, decreased strength, and pain.   ACTIVITY LIMITATIONS: carrying, lifting, bending, sitting, standing, squatting, stairs, and transfers  PARTICIPATION LIMITATIONS: driving, shopping, community activity, and yard work  PERSONAL FACTORS: Time since onset of injury/illness/exacerbation and 1-2 comorbidities: COPD  are also affecting patient's functional outcome.   REHAB POTENTIAL: Good  CLINICAL DECISION MAKING: Stable/uncomplicated  EVALUATION COMPLEXITY: Low   GOALS: Goals reviewed with patient? No  SHORT TERM GOALS: Target date: 04/04/2023   Pt will be compliant and knowledgeable  with initial HEP for improved comfort and carryover Baseline: initial HEP given  Goal status: INITIAL  2.  Pt will self report lower back and L hip pain no greater than 6/10 for improved comfort and functional ability Baseline: 10/10 at worst Goal status: INITIAL   LONG TERM GOALS: Target date: 05/09/2023    Pt will improve FOTO function score to no less than 64% as proxy for functional improvement with home ADLs and community activities Baseline: 57% function Goal status: INITIAL   2.  Pt will self report lower back and L hip pain no greater than 3/10 for improved comfort and functional ability Baseline: 10/10 at worst Goal status: INITIAL   3.  Pt will decrease Five Time Sit to Stand to no less than 11 seconds for improved balance, strength, and functional mobility  Baseline: 16 seconds  Goal status: INITIAL   4.  Pt will improve L hip MMT to no less than 5/5 for all tested motions for improved functional mobility and decreased pain with community activities  Baseline: see MMT chart Goal status: INITIAL  PLAN:  PT FREQUENCY: 1-2x/week  PT DURATION: 8 weeks  PLANNED INTERVENTIONS: Therapeutic exercises, Therapeutic activity, Neuromuscular re-education, Balance training, Gait training, Patient/Family education, Self Care, Joint mobilization, Aquatic Therapy, Dry Needling, Electrical stimulation, Cryotherapy, Moist heat, Vasopneumatic device, Manual therapy, and Re-evaluation.  PLAN FOR NEXT SESSION: assess HEP response, core/hip strengthening, TPDN, piriformis release  Check all possible CPT codes: 16109 - PT Re-evaluation, 97110- Therapeutic Exercise, [phone]- Neuro Re-education, [phone] - Gait Training, [phone] - Manual Therapy, [phone] - Therapeutic Activities, and [phone] - Self Care    Check all conditions that are expected to impact treatment: {Conditions expected to impact treatment: Musculoskeletal disorders   If treatment provided at initial evaluation, no treatment charged due  to lack of authorization.        Eloy End, PT 03/14/2023, 8:28 AM

## 2023-03-30 ENCOUNTER — Ambulatory Visit: Payer: Medicaid Other

## 2023-04-05 ENCOUNTER — Ambulatory Visit: Payer: Medicaid Other

## 2023-04-06 ENCOUNTER — Other Ambulatory Visit: Payer: Self-pay

## 2023-04-06 ENCOUNTER — Emergency Department (HOSPITAL_COMMUNITY): Payer: Medicaid Other

## 2023-04-06 ENCOUNTER — Encounter (HOSPITAL_COMMUNITY): Payer: Self-pay | Admitting: Emergency Medicine

## 2023-04-06 ENCOUNTER — Emergency Department (HOSPITAL_COMMUNITY)
Admission: EM | Admit: 2023-04-06 | Discharge: 2023-04-07 | Disposition: A | Discharge status: Home / Self Care | Payer: Medicaid Other | Attending: Emergency Medicine | Admitting: Emergency Medicine

## 2023-04-06 DIAGNOSIS — R1013 Epigastric pain: Secondary | ICD-10-CM | POA: Insufficient documentation

## 2023-04-06 DIAGNOSIS — J45909 Unspecified asthma, uncomplicated: Secondary | ICD-10-CM | POA: Diagnosis not present

## 2023-04-06 DIAGNOSIS — R079 Chest pain, unspecified: Secondary | ICD-10-CM | POA: Diagnosis not present

## 2023-04-06 DIAGNOSIS — I771 Stricture of artery: Secondary | ICD-10-CM

## 2023-04-06 DIAGNOSIS — J449 Chronic obstructive pulmonary disease, unspecified: Secondary | ICD-10-CM | POA: Diagnosis not present

## 2023-04-06 DIAGNOSIS — R112 Nausea with vomiting, unspecified: Secondary | ICD-10-CM | POA: Diagnosis not present

## 2023-04-06 LAB — COMPREHENSIVE METABOLIC PANEL
ALT: 30 U/L (ref 0–44)
AST: 33 U/L (ref 15–41)
Albumin: 4.7 g/dL (ref 3.5–5.0)
Alkaline Phosphatase: 57 U/L (ref 38–126)
Anion gap: 15 (ref 5–15)
BUN: 12 mg/dL (ref 6–20)
CO2: 21 mmol/L — ABNORMAL LOW (ref 22–32)
Calcium: 9.7 mg/dL (ref 8.9–10.3)
Chloride: 103 mmol/L (ref 98–111)
Creatinine, Ser: 1.14 mg/dL (ref 0.61–1.24)
GFR, Estimated: >60 mL/min (ref >60)
Glucose, Bld: 106 mg/dL — ABNORMAL HIGH (ref 70–99)
Potassium: 4 mmol/L (ref 3.5–5.1)
Sodium: 139 mmol/L (ref 135–145)
Total Bilirubin: 0.9 mg/dL (ref 0.3–1.2)
Total Protein: 8.8 g/dL — ABNORMAL HIGH (ref 6.5–8.1)

## 2023-04-06 LAB — TROPONIN I (HIGH SENSITIVITY)
Troponin I (High Sensitivity): 14 ng/L (ref <18)
Troponin I (High Sensitivity): 13 ng/L (ref <18)

## 2023-04-06 LAB — CBC WITH DIFFERENTIAL/PLATELET
Abs Immature Granulocytes: 0.02 10*3/uL (ref 0.00–0.07)
Basophils Absolute: 0 10*3/uL (ref 0.0–0.1)
Basophils Relative: 0 %
Eosinophils Absolute: 0 10*3/uL (ref 0.0–0.5)
Eosinophils Relative: 0 %
HCT: 45.6 % (ref 39.0–52.0)
Hemoglobin: 15.1 g/dL (ref 13.0–17.0)
Immature Granulocytes: 0 %
Lymphocytes Relative: 13 %
Lymphs Abs: 0.9 10*3/uL (ref 0.7–4.0)
MCH: 29.1 pg (ref 26.0–34.0)
MCHC: 33.1 g/dL (ref 30.0–36.0)
MCV: 87.9 fL (ref 80.0–100.0)
Monocytes Absolute: 0.3 10*3/uL (ref 0.1–1.0)
Monocytes Relative: 4 %
Neutro Abs: 6.1 10*3/uL (ref 1.7–7.7)
Neutrophils Relative %: 83 %
Platelets: 313 10*3/uL (ref 150–400)
RBC: 5.19 MIL/uL (ref 4.22–5.81)
RDW: 13.9 % (ref 11.5–15.5)
WBC: 7.3 10*3/uL (ref 4.0–10.5)
nRBC: 0 % (ref 0.0–0.2)

## 2023-04-06 LAB — LIPASE, BLOOD: Lipase: 31 U/L (ref 11–51)

## 2023-04-06 MED ORDER — ONDANSETRON HCL 4 MG/2ML IJ SOLN
4.0000 mg | Freq: Once | INTRAMUSCULAR | Status: AC
  Administered 2023-04-06: 4 mg via INTRAVENOUS
  Filled 2023-04-06: qty 2

## 2023-04-06 MED ORDER — HYDROMORPHONE HCL 1 MG/ML IJ SOLN
1.0000 mg | Freq: Once | INTRAMUSCULAR | Status: AC
  Administered 2023-04-06: 1 mg via INTRAVENOUS
  Filled 2023-04-06: qty 1

## 2023-04-06 MED ORDER — SODIUM CHLORIDE 0.9 % IV BOLUS
1000.0000 mL | Freq: Once | INTRAVENOUS | Status: AC
  Administered 2023-04-06: 1000 mL via INTRAVENOUS

## 2023-04-06 MED ORDER — MORPHINE SULFATE (PF) 4 MG/ML IV SOLN
4.0000 mg | Freq: Once | INTRAVENOUS | Status: AC
  Administered 2023-04-06: 4 mg via INTRAVENOUS
  Filled 2023-04-06: qty 1

## 2023-04-06 MED ORDER — IOHEXOL 300 MG/ML  SOLN
100.0000 mL | Freq: Once | INTRAMUSCULAR | Status: AC | PRN
  Administered 2023-04-06: 100 mL via INTRAVENOUS

## 2023-04-06 NOTE — ED Notes (Signed)
  Patient given graham crackers and sprite for PO challenge

## 2023-04-06 NOTE — ED Provider Notes (Signed)
[place] EMERGENCY DEPARTMENT AT [facility] Provider Note   CSN: [id number] Arrival date & time: 04/06/23  1937     History  Chief Complaint  Patient presents with   Abdominal Pain   Emesis    Scott Colon is a 62 y.o. male with a past medical history significant for asthma and COPD who presents to the ED due to upper abdominal pain associated with numerous episodes of nonbloody, nonbilious emesis that started last night.  Patient has a history of pancreatitis and states it feels similar to his past episodes.  Denies diarrhea.  Also endorses some left-sided chest pain which he notes is "typical" when he has a flareup of his pancreatitis.  Denies associated shortness of breath.  No cardiac history.  No history of blood clots.  No lower extremity edema.  Admits to drinking roughly 5 beers daily.  Has not had anything to drink for the past 4 days.  No history of complicated alcohol withdrawal.  History obtained from patient and past medical records. No interpreter used during encounter.       Home Medications Prior to Admission medications   Medication Sig Start Date End Date Taking? Authorizing Provider  HYDROcodone-acetaminophen (NORCO/VICODIN) 5-325 MG tablet Take 1 tablet by mouth every 6 (six) hours as needed. 04/29/21   Linwood Dibbles, MD  naproxen (NAPROSYN) 375 MG tablet Take 1 tablet (375 mg total) by mouth 2 (two) times daily. 04/29/21   Linwood Dibbles, MD  ondansetron (ZOFRAN) 4 MG tablet Take 1 tablet (4 mg total) by mouth every 6 (six) hours as needed for nausea or vomiting. 02/22/23   Blue, Soijett A, PA-C  pantoprazole (PROTONIX) 40 MG tablet Take 1 tablet (40 mg total) by mouth daily. 02/22/23   Blue, Soijett A, PA-C  sucralfate (CARAFATE) 1 GM/10ML suspension Take 10 mLs (1 g total) by mouth 4 (four) times daily -  with meals and at bedtime. 02/22/23   Blue, Soijett A, PA-C      Allergies    Patient has no known allergies.    Review of Systems   Review of Systems   Respiratory:  Negative for shortness of breath.   Cardiovascular:  Positive for chest pain.  Gastrointestinal:  Positive for abdominal pain, nausea and vomiting. Negative for diarrhea.    Physical Exam Updated Vital Signs BP (!) 106/58   Pulse 78   Temp 98.5 F (36.9 C)   Resp 18   Ht 6\' 1"  (1.854 m)   Wt 68 kg   SpO2 94%   BMI 19.79 kg/m  Physical Exam Vitals and nursing note reviewed.  Constitutional:      General: He is not in acute distress.    Appearance: He is ill-appearing.     Comments: Appears uncomfortable in bed. Actively vomiting  HENT:     Head: Normocephalic.  Eyes:     Pupils: Pupils are equal, round, and reactive to light.  Cardiovascular:     Rate and Rhythm: Normal rate and regular rhythm.     Pulses: Normal pulses.     Heart sounds: Normal heart sounds. No murmur heard.    No friction rub. No gallop.  Pulmonary:     Effort: Pulmonary effort is normal.     Breath sounds: Normal breath sounds.  Abdominal:     General: Abdomen is flat. There is no distension.     Palpations: Abdomen is soft.     Tenderness: There is abdominal tenderness. There is no guarding or  rebound.     Comments: TTP in epigastric region  Musculoskeletal:        General: Normal range of motion.     Cervical back: Neck supple.  Skin:    General: Skin is warm and dry.  Neurological:     General: No focal deficit present.     Mental Status: He is alert.  Psychiatric:        Mood and Affect: Mood normal.        Behavior: Behavior normal.     ED Results / Procedures / Treatments   Labs (all labs ordered are listed, but only abnormal results are displayed) Labs Reviewed  COMPREHENSIVE METABOLIC PANEL - Abnormal; Notable for the following components:      Result Value   CO2 21 (*)    Glucose, Bld 106 (*)    Total Protein 8.8 (*)    All other components within normal limits  CBC WITH DIFFERENTIAL/PLATELET  LIPASE, BLOOD  URINALYSIS, ROUTINE W REFLEX MICROSCOPIC   TROPONIN I (HIGH SENSITIVITY)  TROPONIN I (HIGH SENSITIVITY)    EKG EKG Interpretation  Date/Time:  Wednesday April 06 2023 20:06:46 EDT Ventricular Rate:  75 PR Interval:  155 QRS Duration: 99 QT Interval:  489 QTC Calculation: 547 R Axis:   79 Text Interpretation: Sinus rhythm Biatrial enlargement LVH with secondary repolarization abnormality Inferior infarct, acute (LCx) Prolonged QT interval >>> Acute MI <<< Disagree with actue MI, T wave inversions v1 and v2 Confirmed by Coralee Pesa [phone]) on 04/06/2023 8:22:00 PM  Radiology DG Chest Portable 1 View  Result Date: 04/06/2023 CLINICAL DATA:  Chest pain EXAM: PORTABLE CHEST 1 VIEW COMPARISON:  Chest x-ray 02/22/2023 FINDINGS: The heart size and mediastinal contours are within normal limits. Nodular density overlying the right lower lung measures 8 mm, possibly a nipple shadow. The lungs are otherwise clear. There is no pleural effusion or pneumothorax. The visualized skeletal structures are unremarkable. IMPRESSION: Nodular density overlying the right lower lung measures 8 mm, possibly a nipple shadow. Recommend repeat chest x-ray with nipple markers. Electronically Signed   By: Darliss Cheney M.D.   On: 04/06/2023 20:26    Procedures Procedures    Medications Ordered in ED Medications  iohexol (OMNIPAQUE) 300 MG/ML solution 100 mL (has no administration in time range)  sodium chloride 0.9 % bolus 1,000 mL (0 mLs Intravenous Stopped 04/06/23 2150)  ondansetron (ZOFRAN) injection 4 mg (4 mg Intravenous Given 04/06/23 2026)  morphine (PF) 4 MG/ML injection 4 mg (4 mg Intravenous Given 04/06/23 2026)  HYDROmorphone (DILAUDID) injection 1 mg (1 mg Intravenous Given 04/06/23 2149)    ED Course/ Medical Decision Making/ A&P Clinical Course as of 04/06/23 2202  Wed Apr 06, 2023  2021 Discussed EKG with Dr. Tresa Endo (STEMI doctor on call). He reviewed EKG and does not feel this is a STEMI. Had j point elevation in previous EKG. Recommends  obtaining troponin. [CA]  2113 Reassessed patient at bedside. Patient admits to some improvement in pain; however still having significant pain. Diluadid given. [CA]    Clinical Course User Index [CA] Mannie Stabile, PA-C                             Medical Decision Making Amount and/or Complexity of Data Reviewed Labs: ordered. Decision-making details documented in ED Course. Radiology: ordered and independent interpretation performed. Decision-making details documented in ED Course. ECG/medicine tests: ordered and independent interpretation performed. Decision-making details documented  in ED Course.  Risk Prescription drug management.   This patient presents to the ED for concern of epigastric pain, this involves an extensive number of treatment options, and is a complaint that carries with it a high risk of complications and morbidity.  The differential diagnosis includes pancreatitis, ACS, PE, etc  62 year old male with history of lupus and pancreatitis presents to the ED due to epigastric pain associated with nausea and vomiting and chest pain.  No cardiac history.  Admits to drinking 5-6 beers daily.  Last drink 5 days ago.  No history of complicated alcohol withdrawal.  Upon arrival patient afebrile, not tachycardic or hypoxic.  Patient appears uncomfortable during initial evaluation actively vomiting.  Abdomen soft, nondistended with epigastric tenderness.  EKG concerning for possible STEMI.  Discussed with STEMI doctor as noted above.  Chest pain appears atypical in nature and likely GI related.  Routine labs ordered.  Troponin to rule out ACS. IVFs, Zofran, and morphine given.  CBC unremarkable.  No leukocytosis.  Normal hemoglobin.  Lipase normal at 31.  CMP significant for mild hyperglycemia 106.  No anion gap.  Normal renal function.  Troponin normal.  EKG with nonspecific ST wave abnormalities and some T wave abnormalities.  Discussed with cardiology as noted above.  Lower  suspicion for ACS given normal troponin.  Patient handed off to Berle Mull, PA-C at shift change pending CT abdomen and resolution in symptoms.   Lives at home Has PCP       Final Clinical Impression(s) / ED Diagnoses Final diagnoses:  Epigastric pain  Nausea and vomiting, unspecified vomiting type  Nonspecific chest pain    Rx / DC Orders ED Discharge Orders     None         Mannie Stabile, PA-C 04/06/23 2202    Rozelle Logan, DO 04/07/23 1638

## 2023-04-06 NOTE — ED Triage Notes (Signed)
  Patient BIB EMS for emesis and abdominal pain that has been going on since last night.  Patient has hx of pancreatitis and lupus.  Patient states he has thrown up more times than he can count.  Pain in middle of abdomen.  Pain 7/10, sharp.

## 2023-04-07 ENCOUNTER — Ambulatory Visit: Payer: Medicaid Other | Attending: Family Medicine

## 2023-04-07 ENCOUNTER — Telehealth: Payer: Self-pay

## 2023-04-07 ENCOUNTER — Emergency Department (HOSPITAL_COMMUNITY): Payer: Medicaid Other

## 2023-04-07 LAB — RAPID URINE DRUG SCREEN, HOSP PERFORMED
Amphetamines: NOT DETECTED
Barbiturates: NOT DETECTED
Benzodiazepines: NOT DETECTED
Cocaine: POSITIVE — AB
Opiates: POSITIVE — AB
Tetrahydrocannabinol: POSITIVE — AB

## 2023-04-07 LAB — URINALYSIS, ROUTINE W REFLEX MICROSCOPIC
Bilirubin Urine: NEGATIVE
Glucose, UA: NEGATIVE mg/dL
Hgb urine dipstick: NEGATIVE
Ketones, ur: 5 mg/dL — AB
Leukocytes,Ua: NEGATIVE
Nitrite: NEGATIVE
Protein, ur: NEGATIVE mg/dL
Specific Gravity, Urine: 1.01 (ref 1.005–1.030)
pH: 5 (ref 5.0–8.0)

## 2023-04-07 MED ORDER — ONDANSETRON HCL 4 MG PO TABS: 4.0000 mg | ORAL_TABLET | Freq: Four times a day (QID) | ORAL | 0 refills | Status: AC

## 2023-04-07 MED ORDER — DICYCLOMINE HCL 20 MG PO TABS: 20.0000 mg | ORAL_TABLET | Freq: Two times a day (BID) | ORAL | 0 refills | Status: AC

## 2023-04-07 MED ORDER — ALUM & MAG HYDROXIDE-SIMETH 200-200-20 MG/5ML PO SUSP
30.0000 mL | Freq: Once | ORAL | Status: AC
  Administered 2023-04-07: 30 mL via ORAL
  Filled 2023-04-07: qty 30

## 2023-04-07 NOTE — Telephone Encounter (Signed)
LVM regarding missed appointment. Confirmed next appointment time and reminded of clinic attendance policy.   1st no-show  Scott Colon, [name] 04/07/23 4:45 PM

## 2023-04-07 NOTE — ED Notes (Signed)
  Patient tolerated sprite and graham crackers

## 2023-04-07 NOTE — Discharge Instructions (Addendum)
Abdominal pain-I recommend a bland diet and advancing as tolerated, give you Zofran for nausea Bentyl for pain please take as prescribed. 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

## 2023-04-07 NOTE — ED Provider Notes (Signed)
Received at shift change from Claudette Stapler , PA-C please see her note for full detail  In short patient with medical history including asthma, COPD, alcohol dependency presenting with complaints of epigastric tenderness which started yesterday, states pain has been constant, states that he has been unable to tolerate p.o., he denies any bloody emesis or coffee-ground emesis still passing gas having normal bowel movements.  He states that last drink alcohol on Saturday, he states that this is typically how he feels when he has pancreatitis.  He denies any actual chest pain shortness of breath states pain does not radiate into his back he has no history of dissections or aneurysms no connective tissue disorders.  Per previous provider follow-up on CT scan and discharge accordingly. Physical Exam  BP 104/64 (BP Location: Left Arm)   Pulse 77   Temp 98.3 F (36.8 C) (Oral)   Resp 15   Ht 6\' 1"  (1.854 m)   Wt 68 kg   SpO2 95%   BMI 19.79 kg/m   Physical Exam Vitals and nursing note reviewed.  Constitutional:      General: He is not in acute distress.    Appearance: He is not ill-appearing.  HENT:     Head: Normocephalic and atraumatic.     Nose: No congestion.  Eyes:     Conjunctiva/sclera: Conjunctivae normal.  Cardiovascular:     Rate and Rhythm: Normal rate and regular rhythm.     Pulses: Normal pulses.     Heart sounds: No murmur heard.    No friction rub. No gallop.  Pulmonary:     Effort: No respiratory distress.     Breath sounds: No wheezing, rhonchi or rales.  Abdominal:     Palpations: Abdomen is soft.     Tenderness: There is abdominal tenderness. There is no right CVA tenderness or left CVA tenderness.     Comments: Abdomen nondistended, soft, minimal epigastric tenderness without guarding rebound is or peritoneal sign.  No pulsating mass present my exam.  Musculoskeletal:     Comments: Patient has strong equal pulses bilaterally upper and lower extremities,  2-second capillary refill, moving the upper and lower extremities without difficulty.  Skin:    General: Skin is warm and dry.  Neurological:     Mental Status: He is alert.  Psychiatric:        Mood and Affect: Mood normal.     Procedures  Procedures  ED Course / MDM   Clinical Course as of 04/07/23 0209  Wed Apr 06, 2023  2021 Discussed EKG with Dr. Tresa Endo (STEMI doctor on call). He reviewed EKG and does not feel this is a STEMI. Had j point elevation in previous EKG. Recommends obtaining troponin. [CA]  2113 Reassessed patient at bedside. Patient admits to some improvement in pain; however still having significant pain. Diluadid given. [CA]    Clinical Course User Index [CA] Mannie Stabile, PA-C   Medical Decision Making Amount and/or Complexity of Data Reviewed Labs: ordered. Radiology: ordered.  Risk OTC drugs. Prescription drug management.    Lab Tests:  I Ordered, and personally interpreted labs.  The pertinent results include: CBC is unremarkable, CMP reveals CO2 of 21 glucose 106, lipase 31, negative delta troponin   Imaging Studies ordered:  I ordered imaging studies including CT abdomen pelvis, chest x-ray I independently visualized and interpreted imaging which showed negative acute findings, possible 8 mm nodule right lower lung I agree with the radiologist interpretation   Cardiac Monitoring:  The patient was maintained on a cardiac monitor.  I personally viewed and interpreted the cardiac monitored which showed an underlying rhythm of: EKG shows ST elevation   Medicines ordered and prescription drug management:  I ordered medication including Dilaudid, GI cocktail I have reviewed the patients home medicines and have made adjustments as needed  Critical Interventions:  N/A   Reevaluation:  Presents with epigastric tenderness, CT imaging as well as lab workup is reassuring, I did note that chest x-ray shows possible 8 mm nodule we will  repeat chest x-ray with nipple marking to further assess.  Repeat chest x-ray shows this was nipple shadowing there is no other abnormalities, patient is now tolerating p.o. pain is well-controlled, he is agreement discharge at this time    Consultations Obtained:  N/A    Test Considered:  N/A    Rule out I have low suspicion for ACS as history is atypical, patient had had negative delta troponin.  EKG was abnormal with ST elevation possible there was some spasms of coronary artery as there is noted cocaine on his rapid urine drug screen.  Suspicion for dissection is also low at this time presentation atypical, states this feels like his normal pancreatitis, he has equal pulses bilaterally upper and lower extremities, blood pressures are obtained in each arm and there is less 20 points for different. low suspicion for PE as patient denies pleuritic chest pain, shortness of breath, no pedal edema noted on exam, vital signs reassuring nontachypneic nonhypoxic.  I doubt limb ischemia in the lower extremities limbs are warm to the touch 2+ dorsal pedal pulses bilaterally to send capillary refill.  Dispostion and problem list  After consideration of the diagnostic results and the patients response to treatment, I feel that the patent would benefit from discharge.  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

## 2023-04-12 ENCOUNTER — Ambulatory Visit: Payer: Medicaid Other

## 2023-04-14 ENCOUNTER — Ambulatory Visit: Payer: Medicaid Other

## 2023-04-19 ENCOUNTER — Ambulatory Visit: Payer: Medicaid Other

## 2023-05-30 ENCOUNTER — Emergency Department (HOSPITAL_COMMUNITY)
Admission: EM | Admit: 2023-05-30 | Discharge: 2023-05-30 | Discharge status: Against Medical Advice | Payer: Medicaid Other | Attending: Emergency Medicine | Admitting: Emergency Medicine

## 2023-05-30 ENCOUNTER — Encounter (HOSPITAL_COMMUNITY): Payer: Self-pay

## 2023-05-30 ENCOUNTER — Other Ambulatory Visit: Payer: Self-pay

## 2023-05-30 DIAGNOSIS — R109 Unspecified abdominal pain: Secondary | ICD-10-CM | POA: Insufficient documentation

## 2023-05-30 DIAGNOSIS — Z5321 Procedure and treatment not carried out due to patient leaving prior to being seen by health care provider: Secondary | ICD-10-CM | POA: Diagnosis not present

## 2023-05-30 LAB — CBC
HCT: 43.8 % (ref 39.0–52.0)
Hemoglobin: 14.5 g/dL (ref 13.0–17.0)
MCH: 29 pg (ref 26.0–34.0)
MCHC: 33.1 g/dL (ref 30.0–36.0)
MCV: 87.6 fL (ref 80.0–100.0)
Platelets: 269 10*3/uL (ref 150–400)
RBC: 5 MIL/uL (ref 4.22–5.81)
RDW: 14.6 % (ref 11.5–15.5)
WBC: 6.6 10*3/uL (ref 4.0–10.5)
nRBC: 0 % (ref 0.0–0.2)

## 2023-05-30 LAB — COMPREHENSIVE METABOLIC PANEL
ALT: 22 U/L (ref 0–44)
AST: 27 U/L (ref 15–41)
Albumin: 4.5 g/dL (ref 3.5–5.0)
Alkaline Phosphatase: 72 U/L (ref 38–126)
Anion gap: 13 (ref 5–15)
BUN: 13 mg/dL (ref 6–20)
CO2: 24 mmol/L (ref 22–32)
Calcium: 9.5 mg/dL (ref 8.9–10.3)
Chloride: 101 mmol/L (ref 98–111)
Creatinine, Ser: 0.98 mg/dL (ref 0.61–1.24)
GFR, Estimated: >60 mL/min (ref >60)
Glucose, Bld: 103 mg/dL — ABNORMAL HIGH (ref 70–99)
Potassium: 4 mmol/L (ref 3.5–5.1)
Sodium: 138 mmol/L (ref 135–145)
Total Bilirubin: 0.7 mg/dL (ref 0.3–1.2)
Total Protein: 8.4 g/dL — ABNORMAL HIGH (ref 6.5–8.1)

## 2023-05-30 LAB — LIPASE, BLOOD: Lipase: 34 U/L (ref 11–51)

## 2023-05-30 NOTE — ED Triage Notes (Signed)
Pt c/o abdominal pain.

## 2023-11-30 ENCOUNTER — Ambulatory Visit: Payer: Medicaid Other | Admitting: Podiatry

## 2023-12-05 ENCOUNTER — Encounter: Payer: Self-pay | Admitting: Podiatry

## 2023-12-05 ENCOUNTER — Ambulatory Visit (INDEPENDENT_AMBULATORY_CARE_PROVIDER_SITE_OTHER): Payer: Medicaid Other | Admitting: Podiatry

## 2023-12-05 DIAGNOSIS — L600 Ingrowing nail: Secondary | ICD-10-CM

## 2023-12-05 NOTE — Progress Notes (Signed)
Subjective:   Patient ID: Scott Colon, male   DOB: 63 y.o.   MRN: [id number]   HPI Patient states he is lost the fourth nail on his right foot and is concerned with thickness and deformity of the fourth nail on the left foot with patient having COPD and still smokes a pack of cigarettes per day.  Patient is not active   Review of Systems  All other systems reviewed and are negative.       Objective:  Physical Exam Vitals and nursing note reviewed.  Constitutional:      Appearance: He is well-developed.  Pulmonary:     Effort: Pulmonary effort is normal.  Musculoskeletal:        General: Normal range of motion.  Skin:    General: Skin is warm.  Neurological:     Mental Status: He is alert.     Neurovascular status found to be intact muscle strength was found to be adequate range of motion adequate with patient found to have a dystrophic thickened nail fourth left with probable ingrown component ingrown nail fourth right fifth right secondary to trauma and probable underlying deformity of nailbeds with good digital perfusion well-oriented x 3     Assessment:  Nail disease ingrown components with structural deviation and loss of nailbed with patient who has moderate risk factors with COPD the and moderate vascular disease      Plan:  H&P full evaluation done and reviewed condition.  I did discuss nails may need to be removed permanently in future depending on regrowth thickening and I do not want him cutting them himself due to high risk factor.  Patient will be seen back all education was rendered today concerning possibility for nail removal in future

## 2023-12-30 ENCOUNTER — Encounter (HOSPITAL_COMMUNITY): Payer: Self-pay

## 2023-12-30 ENCOUNTER — Other Ambulatory Visit: Payer: Self-pay

## 2023-12-30 ENCOUNTER — Emergency Department (HOSPITAL_COMMUNITY)
Admission: EM | Admit: 2023-12-30 | Discharge: 2023-12-30 | Discharge status: Against Medical Advice | Payer: Medicaid Other | Attending: Emergency Medicine | Admitting: Emergency Medicine

## 2023-12-30 ENCOUNTER — Emergency Department (HOSPITAL_COMMUNITY): Payer: Medicaid Other

## 2023-12-30 DIAGNOSIS — H538 Other visual disturbances: Secondary | ICD-10-CM | POA: Diagnosis present

## 2023-12-30 DIAGNOSIS — M542 Cervicalgia: Secondary | ICD-10-CM | POA: Insufficient documentation

## 2023-12-30 DIAGNOSIS — Z5329 Procedure and treatment not carried out because of patient's decision for other reasons: Secondary | ICD-10-CM | POA: Insufficient documentation

## 2023-12-30 DIAGNOSIS — R202 Paresthesia of skin: Secondary | ICD-10-CM | POA: Insufficient documentation

## 2023-12-30 DIAGNOSIS — J45909 Unspecified asthma, uncomplicated: Secondary | ICD-10-CM | POA: Insufficient documentation

## 2023-12-30 DIAGNOSIS — J449 Chronic obstructive pulmonary disease, unspecified: Secondary | ICD-10-CM | POA: Diagnosis not present

## 2023-12-30 LAB — CBC WITH DIFFERENTIAL/PLATELET
Abs Immature Granulocytes: 0.01 10*3/uL (ref 0.00–0.07)
Basophils Absolute: 0 10*3/uL (ref 0.0–0.1)
Basophils Relative: 0 %
Eosinophils Absolute: 0.1 10*3/uL (ref 0.0–0.5)
Eosinophils Relative: 1 %
HCT: 38.8 % — ABNORMAL LOW (ref 39.0–52.0)
Hemoglobin: 12.4 g/dL — ABNORMAL LOW (ref 13.0–17.0)
Immature Granulocytes: 0 %
Lymphocytes Relative: 32 %
Lymphs Abs: 1.8 10*3/uL (ref 0.7–4.0)
MCH: 28.4 pg (ref 26.0–34.0)
MCHC: 32 g/dL (ref 30.0–36.0)
MCV: 88.8 fL (ref 80.0–100.0)
Monocytes Absolute: 0.5 10*3/uL (ref 0.1–1.0)
Monocytes Relative: 9 %
Neutro Abs: 3.4 10*3/uL (ref 1.7–7.7)
Neutrophils Relative %: 58 %
Platelets: 275 10*3/uL (ref 150–400)
RBC: 4.37 MIL/uL (ref 4.22–5.81)
RDW: 13.7 % (ref 11.5–15.5)
WBC: 5.9 10*3/uL (ref 4.0–10.5)
nRBC: 0 % (ref 0.0–0.2)

## 2023-12-30 LAB — COMPREHENSIVE METABOLIC PANEL
ALT: 21 U/L (ref 0–44)
AST: 28 U/L (ref 15–41)
Albumin: 3.8 g/dL (ref 3.5–5.0)
Alkaline Phosphatase: 74 U/L (ref 38–126)
Anion gap: 10 (ref 5–15)
BUN: 15 mg/dL (ref 6–20)
CO2: 23 mmol/L (ref 22–32)
Calcium: 8.9 mg/dL (ref 8.9–10.3)
Chloride: 104 mmol/L (ref 98–111)
Creatinine, Ser: 0.86 mg/dL (ref 0.61–1.24)
GFR, Estimated: >60 mL/min (ref >60)
Glucose, Bld: 88 mg/dL (ref 70–99)
Potassium: 3.7 mmol/L (ref 3.5–5.1)
Sodium: 137 mmol/L (ref 135–145)
Total Bilirubin: 0.6 mg/dL (ref 0.0–1.2)
Total Protein: 7.4 g/dL (ref 6.5–8.1)

## 2023-12-30 LAB — RESP PANEL BY RT-PCR (RSV, FLU A&B, COVID)  RVPGX2
Influenza A by PCR: NEGATIVE
Influenza B by PCR: NEGATIVE
Resp Syncytial Virus by PCR: NEGATIVE
SARS Coronavirus 2 by RT PCR: NEGATIVE

## 2023-12-30 LAB — ETHANOL: Alcohol, Ethyl (B): <10 mg/dL (ref <10)

## 2023-12-30 MED ORDER — IOHEXOL 350 MG/ML SOLN
80.0000 mL | Freq: Once | INTRAVENOUS | Status: AC | PRN
  Administered 2023-12-30: 80 mL via INTRAVENOUS

## 2023-12-30 NOTE — ED Provider Notes (Signed)
 [place] EMERGENCY DEPARTMENT AT [facility] Provider Note   CSN: [id number] Arrival date & time: 12/30/23  1101     History  Chief Complaint  Patient presents with   Neck Injury    Scott Colon is a 63 y.o. male with history of asthma, COPD and prior history of alcohol dependence presents with complaints of left-sided neck pain, blurry vision and left upper extremity numbness and tingling radiates into his left hip.  Denies any injury or trauma.  No prior history of CVA or TIA.  He is not on blood thinners.  Notes that his friends have felt that he is unbalanced in his ambulation.  Although he does not believe he is.  Denies any headache or dizziness.  Blurry vision is bilateral.   Neck Injury      Past Medical History:  Diagnosis Date   Asthma    COPD (chronic obstructive pulmonary disease) (HCC)      Home Medications Prior to Admission medications   Medication Sig Start Date End Date Taking? Authorizing Provider  dicyclomine (BENTYL) 20 MG tablet Take 1 tablet (20 mg total) by mouth 2 (two) times daily. 04/07/23   Carroll Sage, PA-C  HYDROcodone-acetaminophen (NORCO/VICODIN) 5-325 MG tablet Take 1 tablet by mouth every 6 (six) hours as needed. 04/29/21   Linwood Dibbles, MD  naproxen (NAPROSYN) 375 MG tablet Take 1 tablet (375 mg total) by mouth 2 (two) times daily. 04/29/21   Linwood Dibbles, MD  ondansetron (ZOFRAN) 4 MG tablet Take 1 tablet (4 mg total) by mouth every 6 (six) hours. 04/07/23   Carroll Sage, PA-C  pantoprazole (PROTONIX) 40 MG tablet Take 1 tablet (40 mg total) by mouth daily. 02/22/23   Blue, Soijett A, PA-C  sucralfate (CARAFATE) 1 GM/10ML suspension Take 10 mLs (1 g total) by mouth 4 (four) times daily -  with meals and at bedtime. 02/22/23   Blue, Soijett A, PA-C      Allergies    Patient has no known allergies.    Review of Systems   Review of Systems  Musculoskeletal:  Positive for arthralgias and myalgias.    Physical  Exam Updated Vital Signs BP (!) 146/85   Pulse 79   Temp 98.4 F (36.9 C) (Oral)   Resp 18   Ht 6\' 1"  (1.854 m)   Wt 62.6 kg   SpO2 100%   BMI 18.21 kg/m  Physical Exam Vitals and nursing note reviewed.  Constitutional:      General: He is not in acute distress.    Appearance: He is well-developed.  HENT:     Head: Normocephalic and atraumatic.  Eyes:     Conjunctiva/sclera: Conjunctivae normal.  Cardiovascular:     Rate and Rhythm: Normal rate and regular rhythm.     Heart sounds: No murmur heard. Pulmonary:     Effort: Pulmonary effort is normal. No respiratory distress.     Breath sounds: Normal breath sounds. No wheezing or rales.  Abdominal:     Palpations: Abdomen is soft.     Tenderness: There is no abdominal tenderness.  Musculoskeletal:        General: No swelling.     Cervical back: Neck supple.  Skin:    General: Skin is warm and dry.     Capillary Refill: Capillary refill takes less than 2 seconds.  Neurological:     Mental Status: He is alert.     Comments: Patient is alert and oriented. There is no  abnormal phonation. Symmetric smile without facial droop. No pronator drift. Moves all extremities spontaneously. 5/5 strength in lower extremities.  4 out of 5 left upper extremity, 5 out of 5 on the right.  Although there is tenderness to left shoulder.  Pain with forward flexion.  Describes tingling to left upper extremity and left hip.  There is no nystagmus. EOMI, PERRL. Coordination intact with finger to nose.  Ambulation felt to be somewhat wobbly during brief MSE.  During reexamination patient appears to have more of a limp but appears to be ambulating without difficulty  Psychiatric:        Mood and Affect: Mood normal.     ED Results / Procedures / Treatments   Labs (all labs ordered are listed, but only abnormal results are displayed) Labs Reviewed  CBC WITH DIFFERENTIAL/PLATELET - Abnormal; Notable for the following components:      Result Value    Hemoglobin 12.4 (*)    HCT 38.8 (*)    All other components within normal limits  RESP PANEL BY RT-PCR (RSV, FLU A&B, COVID)  RVPGX2  COMPREHENSIVE METABOLIC PANEL  ETHANOL  RAPID URINE DRUG SCREEN, HOSP PERFORMED    EKG None  Radiology No results found.  Procedures Procedures    Medications Ordered in ED Medications  iohexol (OMNIPAQUE) 350 MG/ML injection 80 mL (80 mLs Intravenous Contrast Given 12/30/23 1420)    ED Course/ Medical Decision Making/ A&P                                 Medical Decision Making Amount and/or Complexity of Data Reviewed Labs: ordered. Radiology: ordered.  Risk Prescription drug management.   This patient presents to the ED with chief complaint(s) of numbness and blurry vision .  The complaint involves an extensive differential diagnosis and also carries with it a high risk of complications and morbidity.   pertinent past medical history as listed in HPI  The differential diagnosis includes  CVA, TIA, subarachnoid hemorrhage, vertebral artery dissection, electrolyte abnormality, intoxication, cervical stenosis, sciatica The initial plan is to  Will start with basic labs, CTA head and neck Additional history obtained: Records reviewed Care Everywhere/External Records  Initial Assessment:   Hemodynamically stable, nontoxic-appearing patient presenting with 2 weeks of blurry vision, left neck pain and left upper extremity numbness and tingling that radiates into his left hip.  Friends have reported that he is unbalanced with ambulation.  There was no injury or trauma to suggest subarachnoid hemorrhage.  Although he does have a history of alcohol dependence, which he states is now under control.  On exam he is Zenaida Niece negative, he describes sensation deficits to left upper extremity and left hip.  He does appear to have 4 out of 5 left upper extremity strength compared to 5 out of 5 on the right.  Although he does have tenderness to the left  shoulder and pain with range of motion.  Unclear if this is a musculoskeletal versus neurological etiology.  During brief MSE.  That his ambulation was somewhat wobbly, however upon reexamination appears more accurately that he has a limp at baseline.  He very well may have musculoskeletal etiology such as cervical stenosis or sciatica.  However given the predominant left sided sensation deficits in the setting of blurry vision with neck pain.  Will obtain CTA head and neck.  Independent ECG interpretation:  none  Independent labs interpretation:  The following labs  were independently interpreted:  CBC without significant abnormality, CMP unremarkable, ethanol within normal limits  Independent visualization and interpretation of imaging: I independently visualized the following imaging with scope of interpretation limited to determining acute life threatening conditions related to emergency care: CTA head and neck, results pending  Treatment and Reassessment: No medications administered during visit  Consultations obtained:   none  Disposition:   Patient left AMA.  States he has to go to a doctor's office.  Discussed that I do not have CT results yet and I cannot guarantee there are no acute findings.  Patient understands and states he has to go regardless.  States "its going to be okay."  Left without papers.  Social Determinants of Health:   none  This note was dictated with voice recognition software.  Despite best efforts at proofreading, errors may have occurred which can change the documentation meaning.          Final Clinical Impression(s) / ED Diagnoses Final diagnoses:  Blurry vision  Paresthesia    Rx / DC Orders ED Discharge Orders     None         Halford Decamp, PA-C 12/30/23 1449    Terald Sleeper, MD 12/30/23 [phone]

## 2023-12-30 NOTE — Discharge Instructions (Addendum)
 Patient left without papers.

## 2023-12-30 NOTE — ED Provider Triage Note (Addendum)
 Emergency Medicine Provider Triage Evaluation Note  Scott Colon , a 63 y.o. male  was evaluated in triage.  Pt complains of left sided atraumatic neck pain, left upper extremity numbness, and blurry vision x 2 weeks. Friends told him he appears unbalanced.   Review of Systems  Positive:  Negative:   Physical Exam  BP (!) 151/94 (BP Location: Right Arm)   Pulse 94   Temp 98.4 F (36.9 C) (Oral)   Resp 18   Ht 6\' 1"  (1.854 m)   Wt 62.6 kg   SpO2 100%   BMI 18.21 kg/m  Gen:   Awake, no distress   Resp:  Normal effort  MSK:   Moves extremities without difficulty  Other:  No nystagmus, PERRL, EOMI, ambulation is wobbly, left upper extremity strength 4 out of 5 with extension, no facial asymmetry or abnormal phonation  Medical Decision Making  Medically screening exam initiated at 11:25 AM.  Appropriate orders placed.  Herschel Fleagle was informed that the remainder of the evaluation will be completed by another provider, this initial triage assessment does not replace that evaluation, and the importance of remaining in the ED until their evaluation is complete.     Halford Decamp, PA-C 12/30/23 1133    Halford Decamp, PA-C 12/30/23 1133

## 2023-12-30 NOTE — ED Triage Notes (Signed)
 Patient is here for evaluation of pain that starts in his neck and travels down his entire left side of body. A friend told patient he appears to be off balance, but pt reports he doesn't feel that way. Patient reports blurry vision. All of the symptoms started 2 weeks ago.

## 2024-04-25 ENCOUNTER — Encounter (HOSPITAL_COMMUNITY): Payer: Self-pay

## 2024-04-25 ENCOUNTER — Other Ambulatory Visit: Payer: Self-pay

## 2024-04-25 ENCOUNTER — Emergency Department (HOSPITAL_COMMUNITY)
Admission: EM | Admit: 2024-04-25 | Discharge: 2024-04-25 | Discharge status: Against Medical Advice | Attending: Emergency Medicine | Admitting: Emergency Medicine

## 2024-04-25 DIAGNOSIS — Z5321 Procedure and treatment not carried out due to patient leaving prior to being seen by health care provider: Secondary | ICD-10-CM | POA: Diagnosis not present

## 2024-04-25 DIAGNOSIS — R112 Nausea with vomiting, unspecified: Secondary | ICD-10-CM | POA: Insufficient documentation

## 2024-04-25 DIAGNOSIS — R109 Unspecified abdominal pain: Secondary | ICD-10-CM | POA: Insufficient documentation

## 2024-04-25 DIAGNOSIS — M25552 Pain in left hip: Secondary | ICD-10-CM | POA: Insufficient documentation

## 2024-04-25 LAB — COMPREHENSIVE METABOLIC PANEL WITH GFR
ALT: 27 U/L (ref 0–44)
AST: 36 U/L (ref 15–41)
Albumin: 4 g/dL (ref 3.5–5.0)
Alkaline Phosphatase: 62 U/L (ref 38–126)
Anion gap: 14 (ref 5–15)
BUN: 29 mg/dL — ABNORMAL HIGH (ref 6–20)
CO2: 23 mmol/L (ref 22–32)
Calcium: 9.5 mg/dL (ref 8.9–10.3)
Chloride: 102 mmol/L (ref 98–111)
Creatinine, Ser: 2.01 mg/dL — ABNORMAL HIGH (ref 0.61–1.24)
GFR, Estimated: 37 mL/min — ABNORMAL LOW (ref >60)
Glucose, Bld: 95 mg/dL (ref 70–99)
Potassium: 3.7 mmol/L (ref 3.5–5.1)
Sodium: 139 mmol/L (ref 135–145)
Total Bilirubin: 0.6 mg/dL (ref 0.0–1.2)
Total Protein: 7.2 g/dL (ref 6.5–8.1)

## 2024-04-25 LAB — CBC
HCT: 37.9 % — ABNORMAL LOW (ref 39.0–52.0)
Hemoglobin: 12.3 g/dL — ABNORMAL LOW (ref 13.0–17.0)
MCH: 28.9 pg (ref 26.0–34.0)
MCHC: 32.5 g/dL (ref 30.0–36.0)
MCV: 89.2 fL (ref 80.0–100.0)
Platelets: 261 10*3/uL (ref 150–400)
RBC: 4.25 MIL/uL (ref 4.22–5.81)
RDW: 14.3 % (ref 11.5–15.5)
WBC: 5.3 10*3/uL (ref 4.0–10.5)
nRBC: 0 % (ref 0.0–0.2)

## 2024-04-25 LAB — LIPASE, BLOOD: Lipase: 26 U/L (ref 11–51)

## 2024-04-25 NOTE — ED Triage Notes (Signed)
 Pt arrives via EMS from home. Pt reports nausea, vomiting, and abdominal pain since last night. Pt also c/o pain to left hip x 1 week. Pt denies injury. PT is AxOx4.

## 2024-04-26 ENCOUNTER — Emergency Department (HOSPITAL_COMMUNITY)

## 2024-04-26 ENCOUNTER — Emergency Department (HOSPITAL_COMMUNITY)
Admission: EM | Admit: 2024-04-26 | Discharge: 2024-04-27 | Disposition: A | Discharge status: Home / Self Care | Attending: Emergency Medicine | Admitting: Emergency Medicine

## 2024-04-26 ENCOUNTER — Encounter (HOSPITAL_COMMUNITY): Payer: Self-pay

## 2024-04-26 DIAGNOSIS — R059 Cough, unspecified: Secondary | ICD-10-CM | POA: Insufficient documentation

## 2024-04-26 DIAGNOSIS — J449 Chronic obstructive pulmonary disease, unspecified: Secondary | ICD-10-CM | POA: Insufficient documentation

## 2024-04-26 DIAGNOSIS — R109 Unspecified abdominal pain: Secondary | ICD-10-CM | POA: Insufficient documentation

## 2024-04-26 DIAGNOSIS — R000 Tachycardia, unspecified: Secondary | ICD-10-CM | POA: Diagnosis not present

## 2024-04-26 DIAGNOSIS — R0602 Shortness of breath: Secondary | ICD-10-CM | POA: Diagnosis not present

## 2024-04-26 DIAGNOSIS — E871 Hypo-osmolality and hyponatremia: Secondary | ICD-10-CM | POA: Diagnosis not present

## 2024-04-26 DIAGNOSIS — R12 Heartburn: Secondary | ICD-10-CM | POA: Diagnosis not present

## 2024-04-26 DIAGNOSIS — R079 Chest pain, unspecified: Secondary | ICD-10-CM | POA: Diagnosis present

## 2024-04-26 LAB — CBC
HCT: 45.6 % (ref 39.0–52.0)
Hemoglobin: 14.7 g/dL (ref 13.0–17.0)
MCH: 28 pg (ref 26.0–34.0)
MCHC: 32.2 g/dL (ref 30.0–36.0)
MCV: 86.9 fL (ref 80.0–100.0)
Platelets: 288 10*3/uL (ref 150–400)
RBC: 5.25 MIL/uL (ref 4.22–5.81)
RDW: 13.7 % (ref 11.5–15.5)
WBC: 6.1 10*3/uL (ref 4.0–10.5)
nRBC: 0 % (ref 0.0–0.2)

## 2024-04-26 LAB — TROPONIN I (HIGH SENSITIVITY)
Troponin I (High Sensitivity): 10 ng/L (ref <18)
Troponin I (High Sensitivity): 9 ng/L (ref <18)

## 2024-04-26 LAB — COMPREHENSIVE METABOLIC PANEL WITH GFR
ALT: 45 U/L — ABNORMAL HIGH (ref 0–44)
AST: 66 U/L — ABNORMAL HIGH (ref 15–41)
Albumin: 4.5 g/dL (ref 3.5–5.0)
Alkaline Phosphatase: 65 U/L (ref 38–126)
Anion gap: 13 (ref 5–15)
BUN: 24 mg/dL — ABNORMAL HIGH (ref 6–20)
CO2: 24 mmol/L (ref 22–32)
Calcium: 9.1 mg/dL (ref 8.9–10.3)
Chloride: 97 mmol/L — ABNORMAL LOW (ref 98–111)
Creatinine, Ser: 1.32 mg/dL — ABNORMAL HIGH (ref 0.61–1.24)
GFR, Estimated: >60 mL/min (ref >60)
Glucose, Bld: 104 mg/dL — ABNORMAL HIGH (ref 70–99)
Potassium: 4.6 mmol/L (ref 3.5–5.1)
Sodium: 134 mmol/L — ABNORMAL LOW (ref 135–145)
Total Bilirubin: 0.8 mg/dL (ref 0.0–1.2)
Total Protein: 8.3 g/dL — ABNORMAL HIGH (ref 6.5–8.1)

## 2024-04-26 LAB — RESP PANEL BY RT-PCR (RSV, FLU A&B, COVID)  RVPGX2
Influenza A by PCR: NEGATIVE
Influenza B by PCR: NEGATIVE
Resp Syncytial Virus by PCR: NEGATIVE
SARS Coronavirus 2 by RT PCR: NEGATIVE

## 2024-04-26 MED ORDER — MORPHINE SULFATE (PF) 4 MG/ML IV SOLN
4.0000 mg | Freq: Once | INTRAVENOUS | Status: AC
  Administered 2024-04-26: 4 mg via INTRAVENOUS
  Filled 2024-04-26: qty 1

## 2024-04-26 MED ORDER — ALBUTEROL SULFATE (2.5 MG/3ML) 0.083% IN NEBU
2.5000 mg | INHALATION_SOLUTION | Freq: Once | RESPIRATORY_TRACT | Status: AC
  Administered 2024-04-26: 2.5 mg via RESPIRATORY_TRACT
  Filled 2024-04-26: qty 3

## 2024-04-26 MED ORDER — IOHEXOL 300 MG/ML  SOLN
100.0000 mL | Freq: Once | INTRAMUSCULAR | Status: AC | PRN
  Administered 2024-04-26: 100 mL via INTRAVENOUS

## 2024-04-26 MED ORDER — ONDANSETRON HCL 4 MG/2ML IJ SOLN
4.0000 mg | Freq: Once | INTRAMUSCULAR | Status: AC
  Administered 2024-04-26: 4 mg via INTRAVENOUS
  Filled 2024-04-26: qty 2

## 2024-04-26 NOTE — ED Triage Notes (Signed)
 Pt states that he has been having central CP with SOB all day with nausea and dizziness and chills. Hx of COPD

## 2024-04-26 NOTE — ED Notes (Addendum)
 na

## 2024-04-26 NOTE — ED Provider Notes (Signed)
 [place] EMERGENCY DEPARTMENT AT [facility] Provider Note   CSN: [id number] Arrival date & time: 04/26/24  1947     Patient presents with: Chest Pain   Scott Colon is a 63 y.o. male past medical history significant for asthma and COPD presents today for central chest pain and shortness of breath all day.  Patient also reports nausea, dizziness, cough, congestion, nausea, vomiting, abdominal pain and chills.    Chest Pain Associated symptoms: abdominal pain, cough, dizziness, nausea, shortness of breath and vomiting        Prior to Admission medications   Medication Sig Start Date End Date Taking? Authorizing Provider  dicyclomine  (BENTYL ) 20 MG tablet Take 1 tablet (20 mg total) by mouth 2 (two) times daily. 04/07/23   Waylan Elsie PARAS, PA-C  HYDROcodone -acetaminophen  (NORCO/VICODIN) 5-325 MG tablet Take 1 tablet by mouth every 6 (six) hours as needed. 04/29/21   Randol Simmonds, MD  naproxen  (NAPROSYN ) 375 MG tablet Take 1 tablet (375 mg total) by mouth 2 (two) times daily. 04/29/21   Randol Simmonds, MD  ondansetron  (ZOFRAN ) 4 MG tablet Take 1 tablet (4 mg total) by mouth every 6 (six) hours. 04/07/23   Waylan Elsie PARAS, PA-C  pantoprazole  (PROTONIX ) 40 MG tablet Take 1 tablet (40 mg total) by mouth daily. 02/22/23   Blue, Soijett A, PA-C  sucralfate  (CARAFATE ) 1 GM/10ML suspension Take 10 mLs (1 g total) by mouth 4 (four) times daily -  with meals and at bedtime. 02/22/23   Blue, Soijett A, PA-C    Allergies: Patient has no known allergies.    Review of Systems  Constitutional:  Positive for chills.  HENT:  Positive for congestion.   Respiratory:  Positive for cough, shortness of breath and wheezing.   Cardiovascular:  Positive for chest pain.  Gastrointestinal:  Positive for abdominal pain, nausea and vomiting.  Neurological:  Positive for dizziness.    Updated Vital Signs BP (!) 116/95   Pulse 78   Temp 98.3 F (36.8 C) (Oral)   Resp 20   SpO2 95%    Physical Exam Vitals and nursing note reviewed.  Constitutional:      General: He is not in acute distress.    Appearance: He is well-developed.  HENT:     Head: Normocephalic and atraumatic.   Eyes:     Extraocular Movements: Extraocular movements intact.     Conjunctiva/sclera: Conjunctivae normal.     Pupils: Pupils are equal, round, and reactive to light.    Cardiovascular:     Rate and Rhythm: Regular rhythm. Tachycardia present.     Heart sounds: Normal heart sounds. No murmur heard. Pulmonary:     Effort: Pulmonary effort is normal. No respiratory distress.     Breath sounds: Wheezing present.  Abdominal:     Palpations: Abdomen is soft.     Tenderness: There is no abdominal tenderness.   Musculoskeletal:        General: No swelling.     Cervical back: Neck supple.     Right lower leg: No edema.     Left lower leg: No edema.   Skin:    General: Skin is warm and dry.     Capillary Refill: Capillary refill takes less than 2 seconds.   Neurological:     General: No focal deficit present.     Mental Status: He is alert. He is disoriented.   Psychiatric:        Mood and Affect: Mood normal.     (  all labs ordered are listed, but only abnormal results are displayed) Labs Reviewed  BASIC METABOLIC PANEL WITH GFR  CBC  URINALYSIS, ROUTINE W REFLEX MICROSCOPIC  TROPONIN I (HIGH SENSITIVITY)    EKG: EKG Interpretation Date/Time:  Thursday April 26 2024 19:55:02 EDT Ventricular Rate:  110 PR Interval:  146 QRS Duration:  94 QT Interval:  339 QTC Calculation: 459 R Axis:   92  Text Interpretation: Sinus tachycardia Right atrial enlargement Right axis deviation Consider left ventricular hypertrophy Similar to prior Confirmed by Franklyn Gills [phone]) on 04/26/2024 7:58:05 PM  Radiology: No results found.   Procedures   Medications Ordered in the ED - No data to display                                  Medical Decision Making Amount and/or  Complexity of Data Reviewed Labs: ordered.   This patient presents to the ED for concern of chest pain, shortness of breath, nausea, vomiting, abdominal pain, this involves an extensive number of treatment options, and is a complaint that carries with it a high risk of complications and morbidity.  The differential diagnosis includes COPD exacerbation, COVID, flu, RSV, pneumonia, viral GI illness, pancreatitis, appendicitis, choledocholithiasis, acute cholecystitis, ulcerative colitis, diverticulitis   Co morbidities / Chronic conditions that complicate the patient evaluation  Asthma and COPD   Lab Tests:  I Ordered, and personally interpreted labs.  The pertinent results include: CBC WNL, troponin 10, mild hyponatremia 134, elevated bun at 24, elevated creatinine at 1.32 mild elevated AST 66, elevated ALT at 45, respiratory panel negative   Imaging Studies ordered:  I ordered imaging studies including chest x-ray and CT abdomen pelvis with contrast I independently visualized and interpreted imaging which showed chest x-ray with no evidence of acute chest disease.  Emphysema.  Stable COPD chest. I agree with the radiologist interpretation   Cardiac Monitoring: / EKG:  The patient was maintained on a cardiac monitor.  I personally viewed and interpreted the cardiac monitored which showed an underlying rhythm of: Sinus rhythm, RAE   Problem List / ED Course / Critical interventions / Medication management  I ordered medication including albuterol  neb I have reviewed the patients home medicines and have made adjustments as needed  Patient signed out to Lamar Schlossman, PA-C pending CT which will determine disposition.  Please refer to their note for full ED course.     Final diagnoses:  None    ED Discharge Orders     None          Francis Ileana LOISE DEVONNA 04/26/24 2202    Franklyn Gills LOISE, MD 04/27/24 [phone]

## 2024-04-27 MED ORDER — SUCRALFATE 1 GM/10ML PO SUSP: 1.0000 g | Freq: Three times a day (TID) | ORAL | 0 refills | Status: DC

## 2024-04-27 MED ORDER — PANTOPRAZOLE SODIUM 40 MG PO TBEC: 40.0000 mg | DELAYED_RELEASE_TABLET | Freq: Every day | ORAL | 0 refills | Status: AC

## 2024-04-27 MED ORDER — PANTOPRAZOLE SODIUM 40 MG PO TBEC: 40.0000 mg | DELAYED_RELEASE_TABLET | Freq: Every day | ORAL | 0 refills | Status: DC

## 2024-04-27 MED ORDER — SUCRALFATE 1 GM/10ML PO SUSP
1.0000 g | Freq: Three times a day (TID) | ORAL | 0 refills | Status: AC
Start: 2024-04-27 — End: ?

## 2024-04-27 MED ORDER — ALUM & MAG HYDROXIDE-SIMETH 200-200-20 MG/5ML PO SUSP
30.0000 mL | Freq: Once | ORAL | Status: AC
  Administered 2024-04-27: 30 mL via ORAL
  Filled 2024-04-27: qty 30

## 2024-04-27 NOTE — ED Provider Notes (Signed)
 Patient reassessed, feeling improved.  Labs and imaging are all reassuring.  Will discharge home with PCP follow-up.  Have refilled the patient's Protonix  and Carafate .   Vicky Charleston, PA-C 04/27/24 0242    Carita Senior, MD 04/27/24 [phone]

## 2024-04-27 NOTE — Discharge Instructions (Addendum)
 Your tests all look good today.  I have refilled your heartburn medication.  Please take as directed.  Return for new or worsening symptoms.

## 2024-08-22 ENCOUNTER — Emergency Department (HOSPITAL_COMMUNITY): Admission: EM | Admit: 2024-08-22 | Discharge: 2024-08-23 | Disposition: A

## 2024-08-22 ENCOUNTER — Emergency Department (HOSPITAL_COMMUNITY)

## 2024-08-22 DIAGNOSIS — J4489 Other specified chronic obstructive pulmonary disease: Secondary | ICD-10-CM | POA: Diagnosis not present

## 2024-08-22 DIAGNOSIS — M545 Low back pain, unspecified: Secondary | ICD-10-CM | POA: Diagnosis not present

## 2024-08-22 DIAGNOSIS — R0602 Shortness of breath: Secondary | ICD-10-CM | POA: Diagnosis present

## 2024-08-22 DIAGNOSIS — J441 Chronic obstructive pulmonary disease with (acute) exacerbation: Secondary | ICD-10-CM

## 2024-08-22 DIAGNOSIS — R1013 Epigastric pain: Secondary | ICD-10-CM | POA: Diagnosis not present

## 2024-08-22 DIAGNOSIS — R0789 Other chest pain: Secondary | ICD-10-CM | POA: Diagnosis not present

## 2024-08-22 LAB — BASIC METABOLIC PANEL WITH GFR
Anion gap: 11 (ref 5–15)
BUN: 12 mg/dL (ref 6–20)
CO2: 23 mmol/L (ref 22–32)
Calcium: 9.2 mg/dL (ref 8.9–10.3)
Chloride: 103 mmol/L (ref 98–111)
Creatinine, Ser: 0.97 mg/dL (ref 0.61–1.24)
GFR, Estimated: >60 mL/min (ref >60)
Glucose, Bld: 146 mg/dL — ABNORMAL HIGH (ref 70–99)
Potassium: 3.8 mmol/L (ref 3.5–5.1)
Sodium: 137 mmol/L (ref 135–145)

## 2024-08-22 LAB — HEPATIC FUNCTION PANEL
ALT: 23 U/L (ref 0–44)
AST: 27 U/L (ref 15–41)
Albumin: 4.1 g/dL (ref 3.5–5.0)
Alkaline Phosphatase: 89 U/L (ref 38–126)
Bilirubin, Direct: 0.1 mg/dL (ref 0.0–0.2)
Indirect Bilirubin: 0.3 mg/dL (ref 0.3–0.9)
Total Bilirubin: 0.4 mg/dL (ref 0.0–1.2)
Total Protein: 7.2 g/dL (ref 6.5–8.1)

## 2024-08-22 LAB — TROPONIN T, HIGH SENSITIVITY: Troponin T High Sensitivity: <15 ng/L (ref 0–19)

## 2024-08-22 LAB — RESP PANEL BY RT-PCR (RSV, FLU A&B, COVID)  RVPGX2
Influenza A by PCR: NEGATIVE
Influenza B by PCR: NEGATIVE
Resp Syncytial Virus by PCR: NEGATIVE
SARS Coronavirus 2 by RT PCR: NEGATIVE

## 2024-08-22 LAB — CBC
HCT: 39 % (ref 39.0–52.0)
Hemoglobin: 12.2 g/dL — ABNORMAL LOW (ref 13.0–17.0)
MCH: 28.1 pg (ref 26.0–34.0)
MCHC: 31.3 g/dL (ref 30.0–36.0)
MCV: 89.9 fL (ref 80.0–100.0)
Platelets: 302 K/uL (ref 150–400)
RBC: 4.34 MIL/uL (ref 4.22–5.81)
RDW: 14.1 % (ref 11.5–15.5)
WBC: 9.6 K/uL (ref 4.0–10.5)
nRBC: 0 % (ref 0.0–0.2)

## 2024-08-22 LAB — LIPASE, BLOOD: Lipase: 45 U/L (ref 11–51)

## 2024-08-22 MED ORDER — PREDNISONE 10 MG PO TABS: 40.0000 mg | ORAL_TABLET | Freq: Every day | ORAL | 0 refills | Status: AC

## 2024-08-22 NOTE — ED Triage Notes (Signed)
 Pt BIB [facility] from home d/t concerns for SOB and epigastric pain with radiation to the back. EMS noticed wheezing insp and exp. Was given duo neb x2, 125 soul medrol, 2 g mg and 650 po tylenol . Was warm to touch. With improvement of SOB. HX COPD and Asthma. IV 20G L Forearm.   EMS BP  159/80, PR 78, NSR, RR 18, 100% RA.

## 2024-08-22 NOTE — Discharge Instructions (Signed)
 I did refer you to cardiology. They will give you a call over the next day or 2 to establish care.  You do have risk factors for cardiac disease process so please follow-up with them outpatient.   I will start you on prednisone  for a COPD exacerbation.  Please take this as prescribed.

## 2024-08-22 NOTE — ED Provider Notes (Signed)
 [place] [facility] AT [facility] Provider Note   CSN: [id number] Arrival date & time: 08/22/24  2131     Patient presents with: Shortness of Breath   Scott Colon is a 63 y.o. male.    Shortness of Breath      Patient presents because of shortness of breath, chest pain, back pain.  Patient states that he started having worsening mid epigastric chest pain starting today.  Happened when he was sitting at rest.  Patient states that subsequently has been pretty persistent.  No exertional chest pain.  Denies any cardiac history.  No ACS history.  Patient does endorse a history of asthma as well as COPD.  Has inhalers at home.  Also endorsing lower back pain.  Has a chronic history lower back pain but seem to be a little bit worse today.  No bowel or bladder continence.  No saddle anesthesia.  EMS arrived, felt like he had some wheezing bilaterally and increased work of breathing.  Was given DuoNeb, 125 mg Solu-Medrol, 2 g of magnesium and 650 mg of p.o. Tylenol .  Shortness of breath improved.  Subsequently was transition to room air by the time he arrived to the ED.  Denies all numbness or tingling anywhere.  Previous medical history reviewed : Patient was last seen in the ED on April 26, 2024.  Was seen because of chest pain.  Negative workup at that time.   Prior to Admission medications   Medication Sig Start Date End Date Taking? Authorizing Provider  predniSONE  (DELTASONE ) 10 MG tablet Take 4 tablets (40 mg total) by mouth daily. 08/22/24  Yes Simon Lavonia SAILOR, MD  dicyclomine  (BENTYL ) 20 MG tablet Take 1 tablet (20 mg total) by mouth 2 (two) times daily. 04/07/23   Waylan Elsie PARAS, PA-C  HYDROcodone -acetaminophen  (NORCO/VICODIN) 5-325 MG tablet Take 1 tablet by mouth every 6 (six) hours as needed. 04/29/21   Randol Simmonds, MD  naproxen  (NAPROSYN ) 375 MG tablet Take 1 tablet (375 mg total) by mouth 2 (two) times daily. 04/29/21   Randol Simmonds, MD  ondansetron   (ZOFRAN ) 4 MG tablet Take 1 tablet (4 mg total) by mouth every 6 (six) hours. 04/07/23   Waylan Elsie PARAS, PA-C  pantoprazole  (PROTONIX ) 40 MG tablet Take 1 tablet (40 mg total) by mouth daily. 04/27/24   Vicky Charleston, PA-C  sucralfate  (CARAFATE ) 1 GM/10ML suspension Take 10 mLs (1 g total) by mouth 4 (four) times daily -  with meals and at bedtime. 04/27/24   Vicky Charleston, PA-C    Allergies: Patient has no known allergies.    Review of Systems  Respiratory:  Positive for shortness of breath.     Updated Vital Signs BP 116/80   Pulse 98   Temp 98.5 F (36.9 C)   Resp 13   SpO2 99%   Physical Exam Vitals and nursing note reviewed.  Constitutional:      General: He is not in acute distress.    Appearance: He is well-developed.  HENT:     Head: Normocephalic and atraumatic.  Eyes:     Conjunctiva/sclera: Conjunctivae normal.  Cardiovascular:     Rate and Rhythm: Normal rate and regular rhythm.     Heart sounds: No murmur heard. Pulmonary:     Effort: Pulmonary effort is normal. No respiratory distress.     Breath sounds: Normal breath sounds.  Abdominal:     Palpations: Abdomen is soft.     Tenderness: There is no abdominal tenderness.  Musculoskeletal:        General: No swelling.     Cervical back: Neck supple.  Skin:    General: Skin is warm and dry.     Capillary Refill: Capillary refill takes less than 2 seconds.  Neurological:     Mental Status: He is alert.  Psychiatric:        Mood and Affect: Mood normal.     (all labs ordered are listed, but only abnormal results are displayed) Labs Reviewed  BASIC METABOLIC PANEL WITH GFR - Abnormal; Notable for the following components:      Result Value   Glucose, Bld 146 (*)    All other components within normal limits  CBC - Abnormal; Notable for the following components:   Hemoglobin 12.2 (*)    All other components within normal limits  RESP PANEL BY RT-PCR (RSV, FLU A&B, COVID)  [id number]  HEPATIC  FUNCTION PANEL  LIPASE, BLOOD  TROPONIN T, HIGH SENSITIVITY  TROPONIN T, HIGH SENSITIVITY    EKG: EKG Interpretation Date/Time:  Wednesday August 22 2024 21:45:04 EDT Ventricular Rate:  92 PR Interval:  157 QRS Duration:  99 QT Interval:  403 QTC Calculation: 499 R Axis:   78  Text Interpretation: Sinus rhythm Right atrial enlargement Probable left ventricular hypertrophy Minimal ST elevation, inferior leads Borderline prolonged QT interval Confirmed by Simon Rea [phone]) on 08/22/2024 9:52:46 PM  Radiology: [facility] Chest Portable 1 View Result Date: 08/22/2024 EXAM: 1 VIEW(S) XRAY OF THE CHEST 08/22/2024 09:58:00 PM COMPARISON: 04/26/2024 CLINICAL HISTORY: CP SOB. Shortness of breath and epigastric pain. FINDINGS: LUNGS AND PLEURA: Hyperinflated lungs. No focal pulmonary opacity. No pulmonary edema. No pleural effusion. No pneumothorax. HEART AND MEDIASTINUM: No acute abnormality of the cardiac and mediastinal silhouettes. BONES AND SOFT TISSUES: No acute osseous abnormality. IMPRESSION: 1. No acute cardiopulmonary process. 2. Hyperinflated lungs. Electronically signed by: Norman Gatlin MD 08/22/2024 10:08 PM EDT RP Workstation: [id number]     Procedures   Medications Ordered in the ED - No data to display                                  Medical Decision Making Amount and/or Complexity of Data Reviewed Labs: ordered. Radiology: ordered.  Risk Prescription drug management.     HPI:    Patient presents because of shortness of breath, chest pain, back pain.  Patient states that he started having worsening mid epigastric chest pain starting today.  Happened when he was sitting at rest.  Patient states that subsequently has been pretty persistent.  No exertional chest pain.  Denies any cardiac history.  No ACS history.  Patient does endorse a history of asthma as well as COPD.  Has inhalers at home.  Also endorsing lower back pain.  Has a chronic history lower back pain but  seem to be a little bit worse today.  No bowel or bladder continence.  No saddle anesthesia.  EMS arrived, felt like he had some wheezing bilaterally and increased work of breathing.  Was given DuoNeb, 125 mg Solu-Medrol, 2 g of magnesium and 650 mg of p.o. Tylenol .  Shortness of breath improved.  Subsequently was transition to room air by the time he arrived to the ED.  Denies all numbness or tingling anywhere.  Previous medical history reviewed : Patient was last seen in the ED on April 26, 2024.  Was seen because of chest pain.  Negative  workup at that time.    MDM:    Upon exam, patient hemodynamic stable.  On room air O2 saturation mid to high 90s.  No tachycardia or tachypnea.  Terms of chest pain, EKG no evidence of STEMI.  No obvious changes compared to baseline.  Will obtain ACS workup with troponin x 2.  In terms of epigastric pain.  Could be GERD versus gastritis.  Patient did have some episode of emesis today.  But given some radiation to the back, will obtain lipase as well as LFTs.  Also obtaining CTA of the chest abdomen pelvis to rule out an, dissection process given the shortness of breath, chest pain with some radiation to the back.  He does have a smoking history show has increased risk factors for AAA.    Reevaluation:   Reevaluated patient.  Remains on room air.  Hemodynamically stable.  No acute tachycardia.  No tachypnea.  O2 saturation high 90s on room air.  Patient to be signed out pending CTA imaging and repeat troponin.   Patient will be treated as a COPD exacerbation if CTA imaging negative as well as of ACS workup negative. Prednisone  has been ordered.   I have also referred to cardiology given the chest pain and his risk factors.    EKG Interpreted by Me: No stemi   Cardiac Tele Interpreted by Me: No arrhythmia    I have independently interpreted the CXR  Social Determinant of Health: Tobacco abuse    Disposition and Follow Up: Cardiology        Final diagnoses:  Other chest pain    ED Discharge Orders          Ordered    Ambulatory referral to Cardiology       Comments: If you have not heard from the Cardiology office within the next 72 hours please call [phone].   08/22/24 2318    predniSONE  (DELTASONE ) 10 MG tablet  Daily        08/22/24 2319               Simon Lavonia SAILOR, MD 08/22/24 [phone]

## 2024-08-23 ENCOUNTER — Emergency Department (HOSPITAL_COMMUNITY)

## 2024-08-23 MED ORDER — HYDROCODONE-ACETAMINOPHEN 5-325 MG PO TABS
1.0000 | ORAL_TABLET | Freq: Once | ORAL | Status: AC
  Administered 2024-08-23: 1 via ORAL
  Filled 2024-08-23: qty 1

## 2024-08-23 MED ORDER — IOHEXOL 350 MG/ML SOLN
100.0000 mL | Freq: Once | INTRAVENOUS | Status: AC | PRN
  Administered 2024-08-23: 100 mL via INTRAVENOUS

## 2024-08-23 NOTE — ED Notes (Signed)
 Patient d/c with home care instructions. VS obtained. IV discontinued.
